# Patient Record
Sex: Male | Born: 1970 | Race: White | Hispanic: No | Marital: Married | Smoking: Never smoker
Health system: Southern US, Community
[De-identification: ages and names within clinical notes are randomized; demographics above are authoritative.]

## PROBLEM LIST (undated history)

## (undated) ENCOUNTER — Emergency Department (HOSPITAL_COMMUNITY): Admission: EM | Payer: Self-pay | Source: Home / Self Care

## (undated) DIAGNOSIS — K219 Gastro-esophageal reflux disease without esophagitis: Secondary | ICD-10-CM

## (undated) DIAGNOSIS — M542 Cervicalgia: Secondary | ICD-10-CM

## (undated) DIAGNOSIS — R12 Heartburn: Secondary | ICD-10-CM

## (undated) HISTORY — PX: SHOULDER SURGERY: SHX246

## (undated) HISTORY — DX: Gastro-esophageal reflux disease without esophagitis: K21.9

---

## 2011-04-20 ENCOUNTER — Encounter (HOSPITAL_COMMUNITY): Payer: BC Managed Care – PPO

## 2011-04-20 ENCOUNTER — Other Ambulatory Visit: Payer: Self-pay | Admitting: Orthopedic Surgery

## 2011-04-30 ENCOUNTER — Ambulatory Visit (HOSPITAL_COMMUNITY)
Admission: RE | Admit: 2011-04-30 | Discharge: 2011-04-30 | Disposition: A | Discharge status: Home / Self Care | Payer: BC Managed Care – PPO | Source: Ambulatory Visit | Attending: Orthopedic Surgery | Admitting: Orthopedic Surgery

## 2011-04-30 DIAGNOSIS — M19019 Primary osteoarthritis, unspecified shoulder: Secondary | ICD-10-CM | POA: Insufficient documentation

## 2011-04-30 DIAGNOSIS — M719 Bursopathy, unspecified: Secondary | ICD-10-CM | POA: Insufficient documentation

## 2011-04-30 DIAGNOSIS — M25819 Other specified joint disorders, unspecified shoulder: Secondary | ICD-10-CM | POA: Insufficient documentation

## 2011-04-30 DIAGNOSIS — Z01812 Encounter for preprocedural laboratory examination: Secondary | ICD-10-CM | POA: Insufficient documentation

## 2011-04-30 DIAGNOSIS — M67919 Unspecified disorder of synovium and tendon, unspecified shoulder: Secondary | ICD-10-CM | POA: Insufficient documentation

## 2011-04-30 DIAGNOSIS — A4902 Methicillin resistant Staphylococcus aureus infection, unspecified site: Secondary | ICD-10-CM | POA: Insufficient documentation

## 2011-04-30 DIAGNOSIS — M24119 Other articular cartilage disorders, unspecified shoulder: Secondary | ICD-10-CM | POA: Insufficient documentation

## 2011-04-30 LAB — CBC
HCT: 42.7 % (ref 39.0–52.0)
MCH: 30.2 pg (ref 26.0–34.0)
MCV: 88.2 fL (ref 78.0–100.0)
Platelets: 129 10*3/uL — ABNORMAL LOW (ref 150–400)
RDW: 12.8 % (ref 11.5–15.5)

## 2011-05-06 NOTE — Op Note (Signed)
NAME:  James Cherry, James Cherry NO.:  [id number]  MEDICAL RECORD NO.:  [id number]  LOCATION:  DAYL                         FACILITY:  [facility]  PHYSICIAN:  Marlowe Kays, M.D.  DATE OF BIRTH:  12-30-1970  DATE OF PROCEDURE:  04/30/2011 DATE OF DISCHARGE:  04/30/2011                              OPERATIVE REPORT   PREOPERATIVE DIAGNOSES: 1. Labral degenerative tear. 2. 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 3. Acromioclavicular joint arthritis, right shoulder.  POSTOPERATIVE DIAGNOSES: 1. Labral degenerative tear. 2. 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 3. Acromioclavicular joint arthritis, right shoulder.  OPERATION: 1. Right shoulder arthroscopy with labral debridement and subacromial     decompression with shaving of the bursal surface of the rotator     cuff. 2. Open distal clavicle resection.  SURGEON:  Marlowe Kays, M.D.  ASSISTANT:  Mr. Idolina Primer, [facility]. ANESTHESIA:  General.  PLAN/JUSTIFICATION FOR PROCEDURE:  Painful right shoulder with MRI demonstrating preoperative diagnoses.  DESCRIPTION OF PROCEDURE:  Prophylactic antibiotics due to MRSA culture from his nose, vancomycin was used.  Satisfactory interscalene block by anesthesia, satisfied general anesthesia, beach-chair position on Allen frame, right shoulder girdle was prepped with DuraPrep and draped in sterile field.  Anatomy of the shoulder joint was marked out and time out was performed, I injected location for the posterior and lateral portals in subacromial space and for the incision for distal clavicle and with Marcaine with adrenaline.  Through posterior soft spot portal, we atraumatically entered the glenohumeral joint and on inspection found that the labral tear involved 180 degrees of the labrum working from the side of the biceps anchor.  Accordingly, I advanced the scope between the subscapularis and biceps tendon and using switching  stick made an anterior incision.  Over the switching stick, I then placed a metal cannula over the switching stick by 4.2 shaver entering the joint and shaving down the labral tear until it was smooth as possible.  There was some minimal wear of the glenoid.  I then redirected the scope to the subacromial space, he had large amount of bursal tissue.  I cleared the majority of this out with a 4.2 shaver.  I then brought an ArthroCare vaporizer and began removing soft tissue from the undersurface of the acromion back identifying the [facility] joint.  I followed this with a 4-mm oval bur and began burring down the subacromial space back and forth between the bur, the vaporizer, and a 4.2 shaver.  In the process of shaving the bursal surface of the rotator cuff which had been quite irritated but did not appear to have a definite tear and in keeping with the MRI.  At the conclusion of the decompression, she had wide decompression of the rotator cuff documented with pictures with his arm to side and the arm abducted.  I then removed all fluid possible from the subacromial space and we made an open incision over the distal clavicle identifying the [facility] joint with subperiosteal cautery dissection. I then undermined the distal clavicle and measured back 1.5 cm from [facility] joint, placing the scribe line on the clavicle at that point.  I then placed a baby Bennett beneath the clavicle and using  microsaw amputated the clavicle which I removed with towel clip and cautery technique.  I checked and there were no remaining spicules of bone on the apparent clavicle which I covered with bone wax.  I then irrigated the wound well and placed Gelfoam into the resection site and then closed the wound with interrupted #1 Vicryl in periosteal fascial layer, 2-0 Vicryl in subcutaneous tissue, Steri-Strips on the skin, 4-0 nylon in the 3 portals which were also covered with Betadine and Adaptic dry.  Dry sterile dressing and  shoulder immobilizer were applied.  At the time of this dictation, the patient was on its way to recovery room in satisfactory condition with no known complications.          ______________________________ Marlowe Kays, M.D.     JA/MEDQ  D:  04/30/2011  T:  04/30/2011  Job:  [id number]  Electronically Signed by Marlowe Kays M.D. on 05/06/2011 02:22:51 PM

## 2013-08-11 ENCOUNTER — Emergency Department (HOSPITAL_COMMUNITY)
Admission: EM | Admit: 2013-08-11 | Discharge: 2013-08-11 | Disposition: A | Discharge status: Home / Self Care | Payer: BC Managed Care – PPO | Attending: Emergency Medicine | Admitting: Emergency Medicine

## 2013-08-11 ENCOUNTER — Emergency Department (HOSPITAL_COMMUNITY): Payer: BC Managed Care – PPO

## 2013-08-11 ENCOUNTER — Encounter (HOSPITAL_COMMUNITY): Payer: Self-pay

## 2013-08-11 DIAGNOSIS — M549 Dorsalgia, unspecified: Secondary | ICD-10-CM | POA: Insufficient documentation

## 2013-08-11 DIAGNOSIS — R0789 Other chest pain: Secondary | ICD-10-CM | POA: Insufficient documentation

## 2013-08-11 DIAGNOSIS — R209 Unspecified disturbances of skin sensation: Secondary | ICD-10-CM | POA: Insufficient documentation

## 2013-08-11 DIAGNOSIS — Z880 Allergy status to penicillin: Secondary | ICD-10-CM | POA: Insufficient documentation

## 2013-08-11 DIAGNOSIS — Z7982 Long term (current) use of aspirin: Secondary | ICD-10-CM | POA: Insufficient documentation

## 2013-08-11 DIAGNOSIS — R079 Chest pain, unspecified: Secondary | ICD-10-CM

## 2013-08-11 HISTORY — DX: Cervicalgia: M54.2

## 2013-08-11 HISTORY — DX: Heartburn: R12

## 2013-08-11 LAB — COMPREHENSIVE METABOLIC PANEL
Albumin: 3.9 g/dL (ref 3.5–5.2)
Alkaline Phosphatase: 64 U/L (ref 39–117)
BUN: 13 mg/dL (ref 6–23)
CO2: 28 mEq/L (ref 19–32)
Calcium: 9.7 mg/dL (ref 8.4–10.5)
Creatinine, Ser: 1.26 mg/dL (ref 0.50–1.35)
GFR calc Af Amer: 79 mL/min — ABNORMAL LOW (ref >90)
GFR calc non Af Amer: 68 mL/min — ABNORMAL LOW (ref >90)
Glucose, Bld: 115 mg/dL — ABNORMAL HIGH (ref 70–99)
Total Protein: 6.5 g/dL (ref 6.0–8.3)

## 2013-08-11 LAB — CBC WITH DIFFERENTIAL/PLATELET
Basophils Absolute: 0 10*3/uL (ref 0.0–0.1)
Eosinophils Absolute: 0.2 10*3/uL (ref 0.0–0.7)
Eosinophils Relative: 4 % (ref 0–5)
HCT: 44.6 % (ref 39.0–52.0)
Hemoglobin: 15.2 g/dL (ref 13.0–17.0)
Lymphocytes Relative: 42 % (ref 12–46)
Lymphs Abs: 2.2 10*3/uL (ref 0.7–4.0)
MCH: 30.5 pg (ref 26.0–34.0)
MCHC: 34.1 g/dL (ref 30.0–36.0)
MCV: 89.6 fL (ref 78.0–100.0)
Monocytes Absolute: 0.4 10*3/uL (ref 0.1–1.0)
Platelets: 171 10*3/uL (ref 150–400)
RBC: 4.98 MIL/uL (ref 4.22–5.81)
WBC: 5.1 10*3/uL (ref 4.0–10.5)

## 2013-08-11 LAB — POCT I-STAT TROPONIN I: Troponin i, poc: 0 ng/mL (ref 0.00–0.08)

## 2013-08-11 NOTE — ED Notes (Signed)
Patient complaining of a chest heaviness that start about 2 weeks ago. Stated that he has been having a lot of stress. Currently not having any chest pain. When he has chest pain he can fell it in his back behind his heart. Occasionally has heart burn. Is not on any BP medications.

## 2013-08-11 NOTE — ED Notes (Signed)
Trixie Dredge in to talk with patient

## 2013-08-11 NOTE — ED Provider Notes (Signed)
CSN: [id number]     Arrival date & time 08/11/13  1413 History   First MD Initiated Contact with Patient 08/11/13 1421     Chief Complaint  Patient presents with  . Chest Pain   (Consider location/radiation/quality/duration/timing/severity/associated sxs/prior Treatment) HPI Patient presents with concern of chest pain.  States he has had intermittent chest pain for the past 12 days.  States the pain began while he was sitting at work, on a day that was much more stressful than usual.  Pain is located over left chest, described as pressure and squeezing, occasionally is also located in his left upper back, and has occasional tingling in his left hand.  The sensation lasts for seconds to hours.  States that it is improved and may go away with distraction.  States the sensation lasts until he can distract himself.  The pain is not exacerbated by exertion or deep inspiration.  It is improved with stretching and deep inspiration.  Denies nausea, vomiting, lightheadedness, dizziness, SOB, palpitations, leg swelling, abdominal pain, diaphoresis, syncope.   Pt is not a smoker.  He has no medical problems.  No family hx early CAD (mother has had MI and stents, but not until her 99s).  No past medical history on file. No past surgical history on file. No family history on file. History  Substance Use Topics  . Smoking status: Not on file  . Smokeless tobacco: Not on file  . Alcohol Use: Not on file    Review of Systems  Constitutional: Negative for fever.  Respiratory: Negative for cough and shortness of breath.   Cardiovascular: Positive for chest pain. Negative for palpitations and leg swelling.  Gastrointestinal: Negative for nausea, vomiting and abdominal pain.  Musculoskeletal: Positive for back pain.  Skin: Negative for rash.  Neurological: Negative for dizziness, syncope, weakness, light-headedness and headaches.    Allergies  Demerol and Penicillins  Home Medications   Current  Outpatient Rx  Name  Route  Sig  Dispense  Refill  . aspirin 325 MG tablet   Oral   Take 325 mg by mouth daily.         Marland Kitchen ibuprofen (ADVIL,MOTRIN) 200 MG tablet   Oral   Take 200 mg by mouth every 6 (six) hours as needed for pain.          BP 132/94  Pulse 72  Temp(Src) 97.8 F (36.6 C) (Oral)  Resp 16  SpO2 95% Physical Exam  Nursing note and vitals reviewed. Constitutional: He appears well-developed and well-nourished. No distress.  HENT:  Head: Normocephalic and atraumatic.  Neck: Neck supple.  Cardiovascular: Normal rate and regular rhythm.   Pulmonary/Chest: Effort normal and breath sounds normal. No respiratory distress. He has no wheezes. He has no rales.  Abdominal: Soft. He exhibits no distension and no mass. There is no tenderness. There is no rebound and no guarding.  Musculoskeletal: He exhibits no edema.  Neurological: He is alert. He exhibits normal muscle tone.  Skin: He is not diaphoretic.    ED Course  Procedures (including critical care time) Labs Review Labs Reviewed  COMPREHENSIVE METABOLIC PANEL - Abnormal; Notable for the following:    Glucose, Bld 115 (*)    GFR calc non Af Amer 68 (*)    GFR calc Af Amer 79 (*)    All other components within normal limits  CBC WITH DIFFERENTIAL  POCT I-STAT TROPONIN I   Imaging Review Dg Chest 2 View  08/11/2013   CLINICAL DATA:  chest pain  EXAM: CHEST  2 VIEW  COMPARISON:  None.  FINDINGS: The lungs are clear. Heart size and pulmonary vascularity are normal. No adenopathy. No pneumothorax. There is slight upper thoracic dextroscoliosis.  IMPRESSION: No edema or consolidation.   Electronically Signed   By: Bretta Bang M.D.   On: 08/11/2013 15:30     Date: 08/11/2013  Rate: 77  Rhythm: normal sinus rhythm  QRS Axis: normal  Intervals: normal  ST/T Wave abnormalities: normal  Conduction Disutrbances: none  Narrative Interpretation:   Old EKG Reviewed: not available  Discussed pt with Dr  Romeo Apple    MDM   1. Chest pain      Patient with atypical chest pain at rest that appears to be worse with /related to emotional stress at work.  He is low risk - HEART score is zero. No family hx early CAD. Discussed results and follow up  with patient.  Pt given return precautions.  Pt verbalizes understanding and agrees with plan.      I doubt any other EMC precluding discharge at this time including, but not necessarily limited to the following: ACS, PE, aortic dissection, pneumonia, cholecystitis     Trixie Dredge, PA-C 08/11/13 1548

## 2013-08-11 NOTE — ED Notes (Signed)
Patient had episode of chest pressure that lasted apprx 30sec.

## 2013-08-11 NOTE — ED Provider Notes (Signed)
Medical screening examination/treatment/procedure(s) were performed by non-physician practitioner and as supervising physician I was immediately available for consultation/collaboration.   Junius Argyle, MD 08/11/13 1921

## 2013-08-24 LAB — LIPID PANEL
Cholesterol: 209 — AB (ref 0–200)
HDL: 39 (ref 35–70)
LDL Cholesterol: 126
Triglycerides: 217 — AB (ref 40–160)

## 2013-09-14 ENCOUNTER — Ambulatory Visit (INDEPENDENT_AMBULATORY_CARE_PROVIDER_SITE_OTHER): Payer: BC Managed Care – PPO | Admitting: Physician Assistant

## 2013-09-14 ENCOUNTER — Encounter: Payer: Self-pay | Admitting: Cardiovascular Disease

## 2013-09-14 DIAGNOSIS — R079 Chest pain, unspecified: Secondary | ICD-10-CM

## 2013-09-14 NOTE — Progress Notes (Signed)
Exercise Treadmill Test James Cherry is a 42 y.o. male non-smoker with no hx of CAD, HTN, HL, diabetes mellitus referred by his PCP for ETT due to hx of recent chest pain.  He was in the ED last month with CP.  CEs were normal.  No hx of DOE, syncope.  Exam unremarkable.  ECG:  NSR, no ST changes.  Pre-Exercise Testing Evaluation Rhythm: normal sinus  Rate: 72 bpm     Test  Exercise Tolerance Test Ordering MD: Kristeen Miss, MD  Interpreting MD: Tereso Newcomer, PA-C  Unique Test No: 1  Treadmill:  1  Indication for ETT: chest pain - rule out ischemia  Contraindication to ETT: No   Stress Modality: exercise - treadmill  Cardiac Imaging Performed: non   Protocol: standard Bruce - maximal  Max BP:  170/63  Max MPHR (bpm):  176 85% MPR (bpm):  150  MPHR obtained (bpm):  181 % MPHR obtained:  102  Reached 85% MPHR (min:sec):  9:17 Total Exercise Time (min-sec):  11:59  Workload in METS:  13.4 Borg Scale: 17  Reason ETT Terminated:  desired heart rate attained    ST Segment Analysis At Rest: normal ST segments - no evidence of significant ST depression With Exercise: no evidence of significant ST depression  Other Information Arrhythmia:  No Angina during ETT:  absent (0) Quality of ETT:  diagnostic  ETT Interpretation:  normal - no evidence of ischemia by ST analysis  Comments: Excellent exercise capacity. No chest pain. Normal BP response to exercise. No ST changes to suggest ischemia.  Recommendations: F/u with PCP as directed. Signed,  Tereso Newcomer, PA-C   09/14/2013 4:08 PM

## 2014-06-23 IMAGING — CR DG CHEST 2V
2 series · 2 of 2 positions shown · non-contrast
Comparison: None.

CLINICAL DATA: chest pain

EXAM:
CHEST  2 VIEW

[w chest pa]
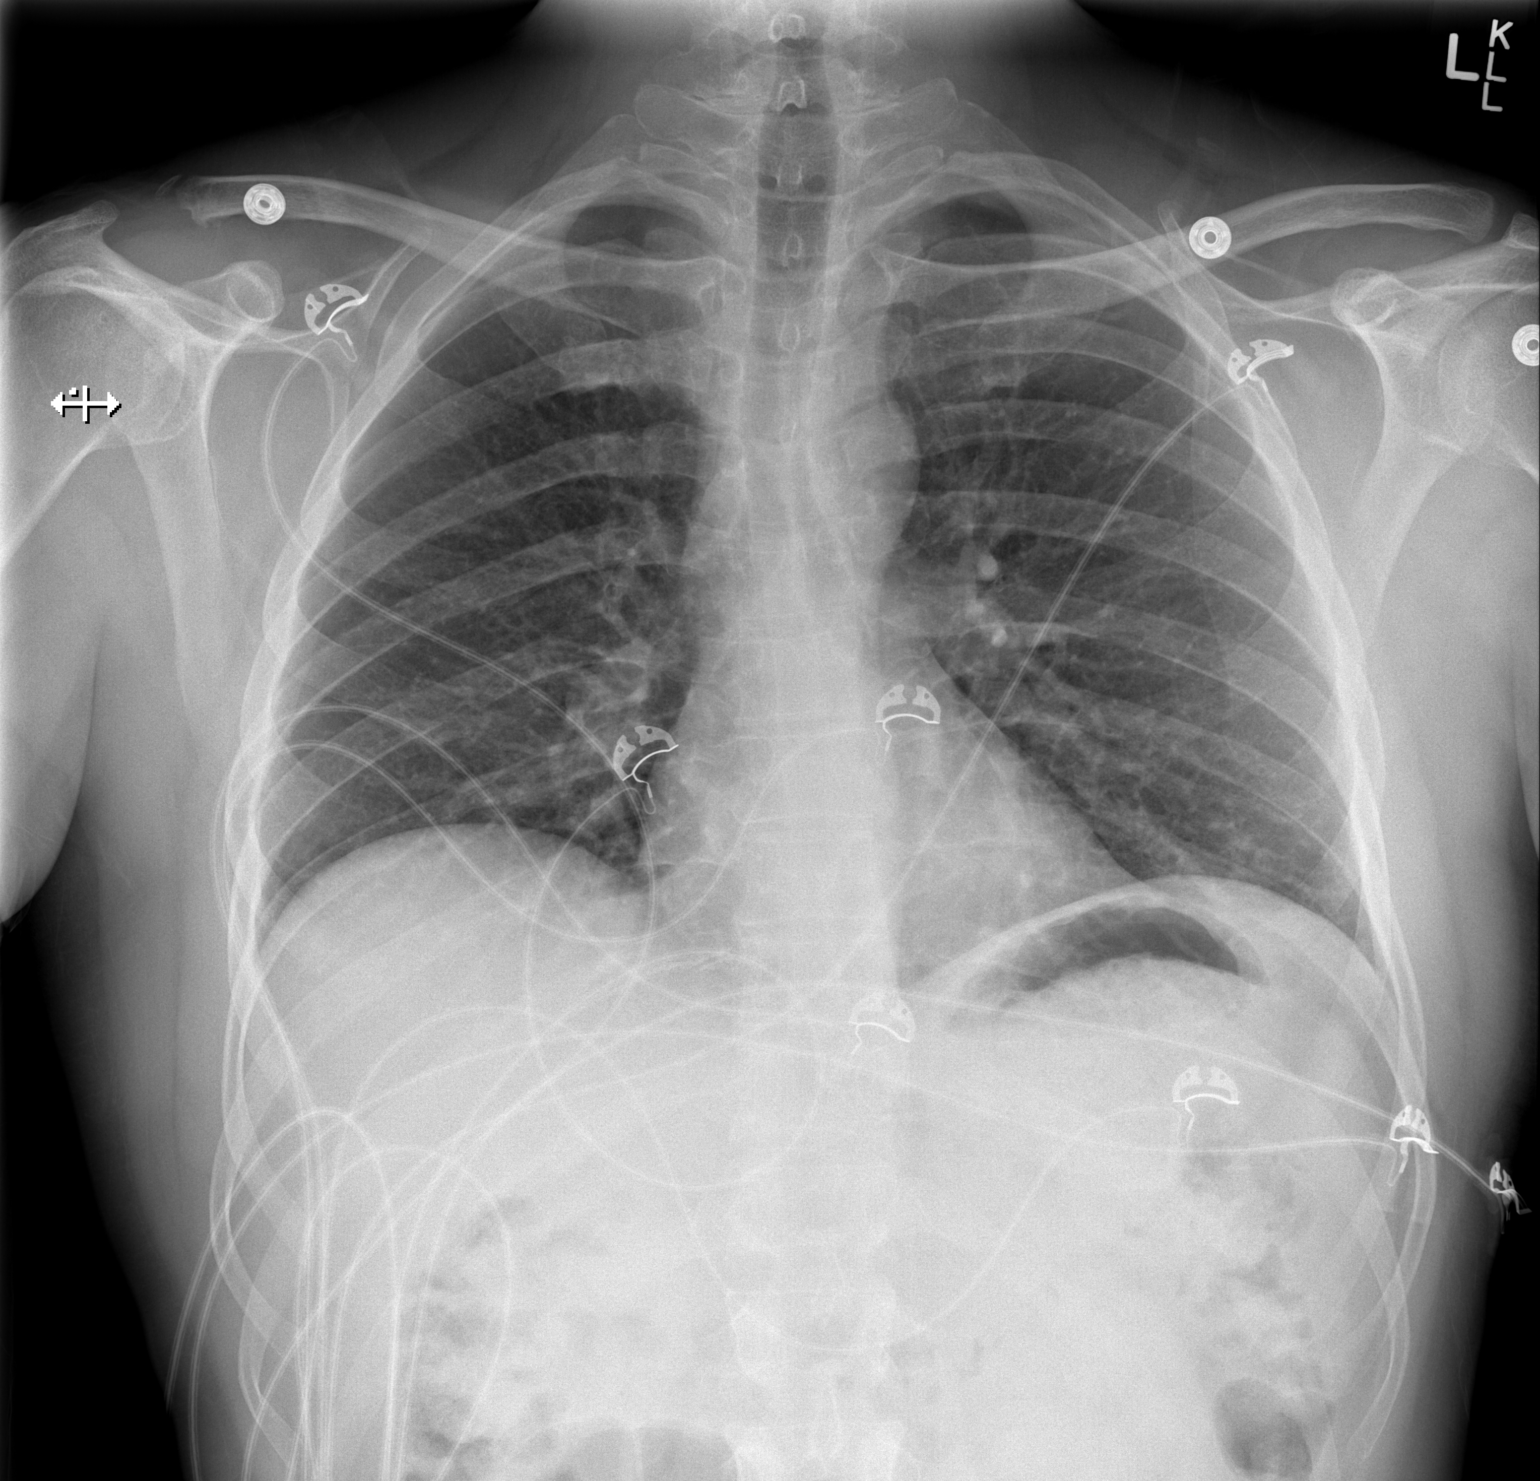

[w chest lat]
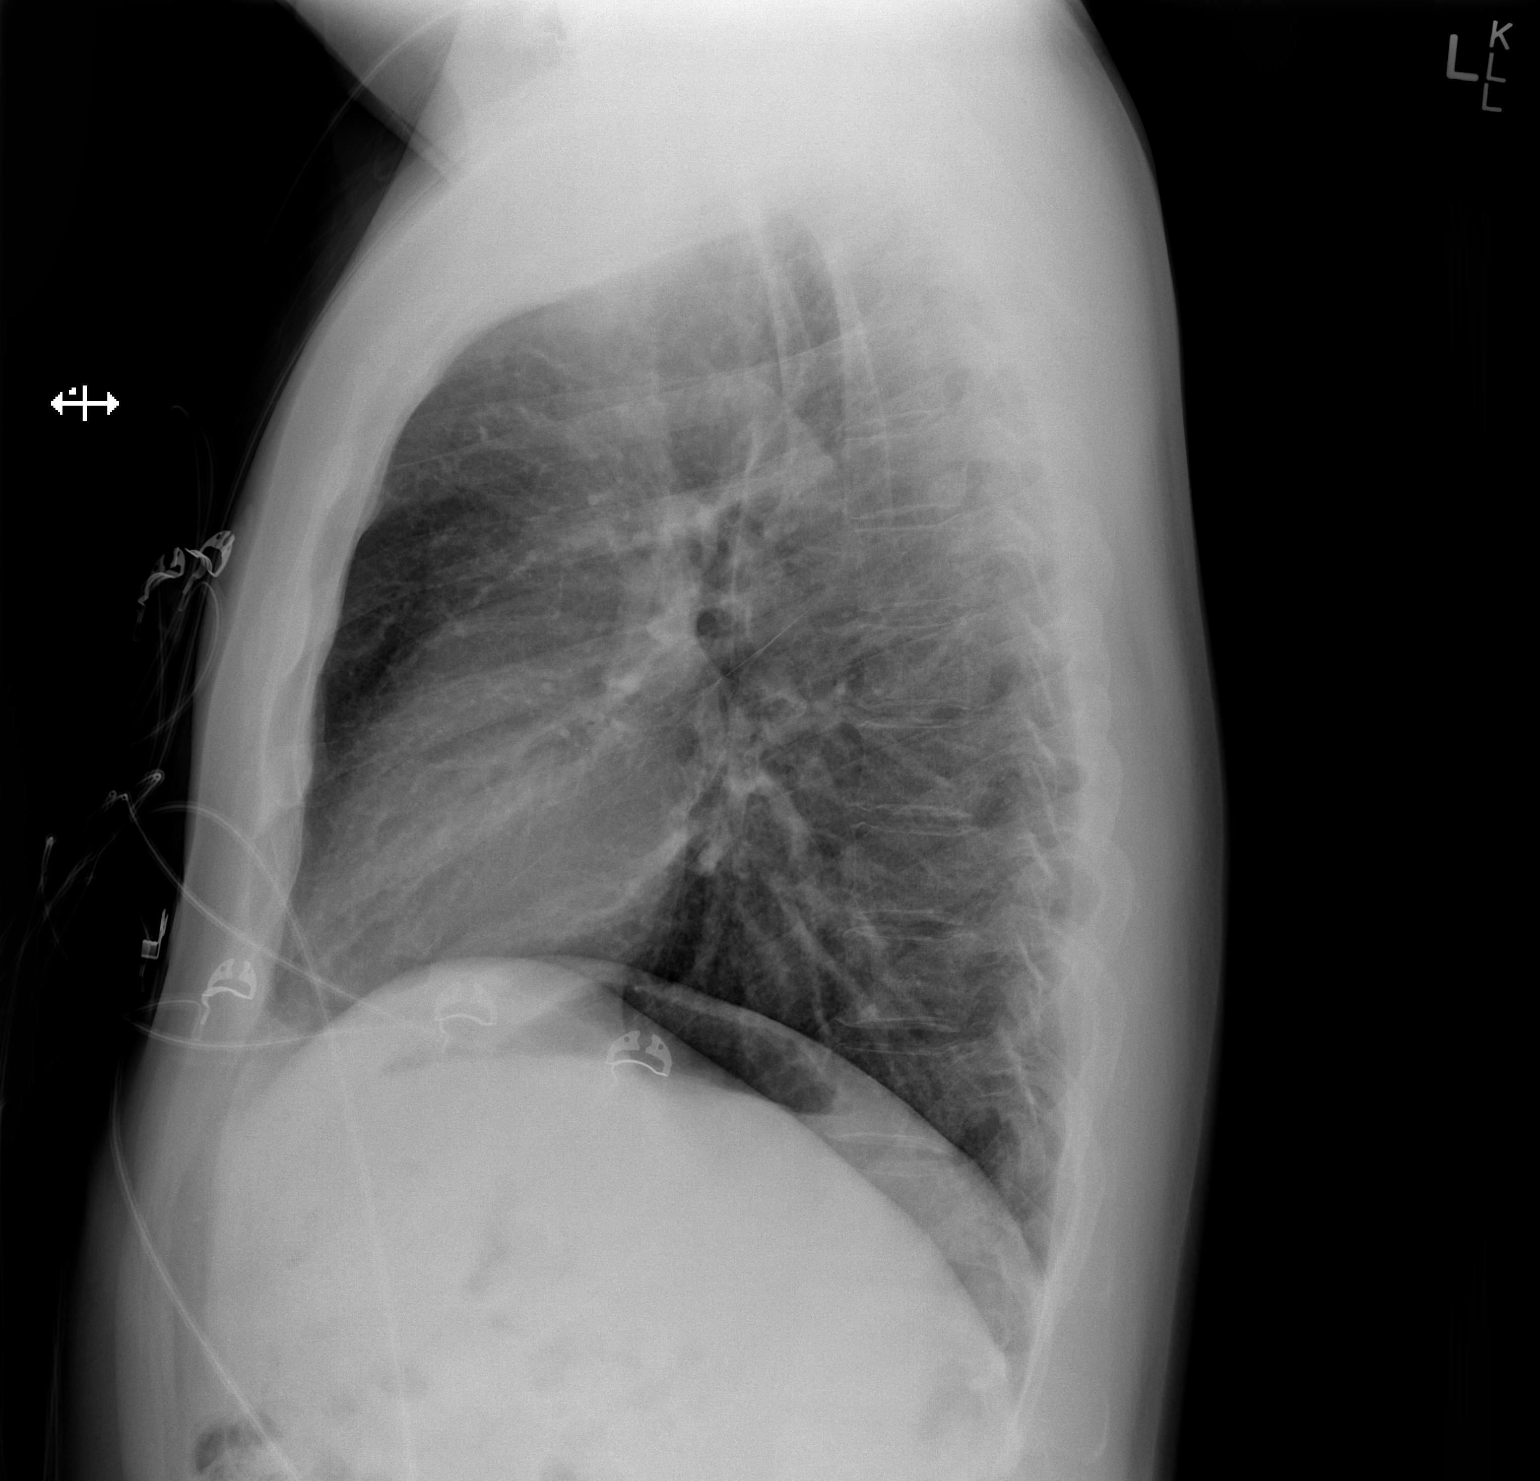

[2 of 2 positions shown; findings below may reference images not displayed]

FINDINGS: The lungs are clear. Heart size and pulmonary vascularity are
normal. No adenopathy. No pneumothorax. There is slight upper
thoracic dextroscoliosis.
IMPRESSION: No edema or consolidation.

## 2019-02-09 ENCOUNTER — Encounter: Payer: Self-pay | Admitting: Adult Health

## 2019-02-09 ENCOUNTER — Ambulatory Visit (INDEPENDENT_AMBULATORY_CARE_PROVIDER_SITE_OTHER): Payer: 59 | Admitting: Adult Health

## 2019-02-09 ENCOUNTER — Other Ambulatory Visit: Payer: Self-pay

## 2019-02-09 VITALS — BP 121/71 | HR 57 | Temp 98.3°F | Ht 68.0 in | Wt 184.8 lb

## 2019-02-09 DIAGNOSIS — Z0000 Encounter for general adult medical examination without abnormal findings: Secondary | ICD-10-CM

## 2019-02-09 DIAGNOSIS — E7800 Pure hypercholesterolemia, unspecified: Secondary | ICD-10-CM | POA: Diagnosis not present

## 2019-02-09 NOTE — Patient Instructions (Signed)
Mediterranean Diet A Mediterranean diet refers to food and lifestyle choices that are based on the traditions of countries located on the [employer]. This way of eating has been shown to help prevent certain conditions and improve outcomes for people who have chronic diseases, like kidney disease and heart disease. What are tips for following this plan? Lifestyle  Cook and eat meals together with your family, when possible.  Drink enough fluid to keep your urine clear or pale yellow.  Be physically active every day. This includes: ? Aerobic exercise like running or swimming. ? Leisure activities like gardening, walking, or housework.  Get 7-8 hours of sleep each night.  If recommended by your health care provider, drink red wine in moderation. This means 1 glass a day for nonpregnant women and 2 glasses a day for men. A glass of wine equals 5 oz (150 mL). Reading food labels   Check the serving size of packaged foods. For foods such as rice and pasta, the serving size refers to the amount of cooked product, not dry.  Check the total fat in packaged foods. Avoid foods that have saturated fat or trans fats.  Check the ingredients list for added sugars, such as corn syrup. Shopping  At the grocery store, buy most of your food from the areas near the walls of the store. This includes: ? Fresh fruits and vegetables (produce). ? Grains, beans, nuts, and seeds. Some of these may be available in unpackaged forms or large amounts (in bulk). ? Fresh seafood. ? Poultry and eggs. ? Low-fat dairy products.  Buy whole ingredients instead of prepackaged foods.  Buy fresh fruits and vegetables in-season from local farmers markets.  Buy frozen fruits and vegetables in resealable bags.  If you do not have access to quality fresh seafood, buy precooked frozen shrimp or canned fish, such as tuna, salmon, or sardines.  Buy small amounts of raw or cooked vegetables, salads, or olives from  the deli or salad bar at your store.  Stock your pantry so you always have certain foods on hand, such as olive oil, canned tuna, canned tomatoes, rice, pasta, and beans. Cooking  Cook foods with extra-virgin olive oil instead of using butter or other vegetable oils.  Have meat as a side dish, and have vegetables or grains as your main dish. This means having meat in small portions or adding small amounts of meat to foods like pasta or stew.  Use beans or vegetables instead of meat in common dishes like chili or lasagna.  Experiment with different cooking methods. Try roasting or broiling vegetables instead of steaming or sauteing them.  Add frozen vegetables to soups, stews, pasta, or rice.  Add nuts or seeds for added healthy fat at each meal. You can add these to yogurt, salads, or vegetable dishes.  Marinate fish or vegetables using olive oil, lemon juice, garlic, and fresh herbs. Meal planning   Plan to eat 1 vegetarian meal one day each week. Try to work up to 2 vegetarian meals, if possible.  Eat seafood 2 or more times a week.  Have healthy snacks readily available, such as: ? Vegetable sticks with hummus. ? [place] yogurt. ? Fruit and nut trail mix.  Eat balanced meals throughout the week. This includes: ? Fruit: 2-3 servings a day ? Vegetables: 4-5 servings a day ? Low-fat dairy: 2 servings a day ? Fish, poultry, or lean meat: 1 serving a day ? Beans and legumes: 2 or more servings a week ?  Nuts and seeds: 1-2 servings a day ? Whole grains: 6-8 servings a day ? Extra-virgin olive oil: 3-4 servings a day  Limit red meat and sweets to only a few servings a month What are my food choices?  Mediterranean diet ? Recommended ? Grains: Whole-grain pasta. Brown rice. Bulgar wheat. Polenta. Couscous. Whole-wheat bread. Orpah Cobb. ? Vegetables: Artichokes. Beets. Broccoli. Cabbage. Carrots. Eggplant. Green beans. Chard. Kale. Spinach. Onions. Leeks. Peas. Squash.  Tomatoes. Peppers. Radishes. ? Fruits: Apples. Apricots. Avocado. Berries. Bananas. Cherries. Dates. Figs. Grapes. Lemons. Melon. Oranges. Peaches. Plums. Pomegranate. ? Meats and other protein foods: Beans. Almonds. Sunflower seeds. Pine nuts. Peanuts. Cod. Salmon. Scallops. Shrimp. Tuna. Tilapia. Clams. Oysters. Eggs. ? Dairy: Low-fat milk. Cheese. Greek yogurt. ? Beverages: Water. Red wine. Herbal tea. ? Fats and oils: Extra virgin olive oil. Avocado oil. Grape seed oil. ? Sweets and desserts: [place] yogurt with honey. Baked apples. Poached pears. Trail mix. ? Seasoning and other foods: Basil. Cilantro. Coriander. Cumin. Mint. Parsley. Sage. Rosemary. Tarragon. Garlic. Oregano. Thyme. Pepper. Balsalmic vinegar. Tahini. Hummus. Tomato sauce. Olives. Mushrooms. ? Limit these ? Grains: Prepackaged pasta or rice dishes. Prepackaged cereal with added sugar. ? Vegetables: Deep fried potatoes (french fries). ? Fruits: Fruit canned in syrup. ? Meats and other protein foods: Beef. Pork. Lamb. Poultry with skin. Hot dogs. Tomasa Blase. ? Dairy: Ice cream. Sour cream. Whole milk. ? Beverages: Juice. Sugar-sweetened soft drinks. Beer. Liquor and spirits. ? Fats and oils: Butter. Canola oil. Vegetable oil. Beef fat (tallow). Lard. ? Sweets and desserts: Cookies. Cakes. Pies. Candy. ? Seasoning and other foods: Mayonnaise. Premade sauces and marinades. ? The items listed may not be a complete list. Talk with your dietitian about what dietary choices are right for you. Summary  The Mediterranean diet includes both food and lifestyle choices.  Eat a variety of fresh fruits and vegetables, beans, nuts, seeds, and whole grains.  Limit the amount of red meat and sweets that you eat.  Talk with your health care provider about whether it is safe for you to drink red wine in moderation. This means 1 glass a day for nonpregnant women and 2 glasses a day for men. A glass of wine equals 5 oz (150 mL). This information  is not intended to replace advice given to you by your health care provider. Make sure you discuss any questions you have with your health care provider. Document Released: 06/18/2016 Document Revised: 07/21/2016 Document Reviewed: 06/18/2016 Elsevier Interactive Patient Education  2019 [employer].   Continue to drink plenty of water, follow Mediterranean diet, and reduce overall ice cream consumption. Increase regular exercise.  Recommend at least 30 minutes daily, 5 days per week of walking, jogging, biking, swimming, YouTube/Pinterest workout videos. Once you meet AAA screening guidelines, we will send you for imaging. Please schedule complete physical in 2 months, fasting labs the week prior. WELCOME TO THE PRACTICE!

## 2019-02-09 NOTE — Assessment & Plan Note (Signed)
Continue to drink plenty of water, follow Mediterranean diet, and reduce overall ice cream consumption. Increase regular exercise.  Recommend at least 30 minutes daily, 5 days per week of walking, jogging, biking, swimming, YouTube/Pinterest workout videos. Once you meet AAA screening guidelines, we will send you for imaging. Please schedule complete physical in 2 months, fasting labs the week prior.

## 2019-02-09 NOTE — Assessment & Plan Note (Signed)
2014 labs- LDL 126 Advised to reduce beef and ice cream intake Increase cardiovascular exercise Will recheck lipids at CPE in 2 months

## 2019-02-09 NOTE — Progress Notes (Signed)
Subjective:    Patient ID: James Cherry, male    DOB: Jan 28, 1971, 48 y.o.   MRN: [id number]  HPI:  James Cherry is here to establish as a new pt.  He is a pleasant 48 year old male. PMH:HLD, Tension HA (estimates 1 HA/months, easily treated with OTC NSAIDs). He is concerned about AAA. He reports paternal uncle passing away in his mid 21s from AAA rupture.  He is unsure of his uncle's social hx. He denies tobacco use He is only 48 years old and his BP is excellent- 121/71, HR 57 He estimates to drink >80 oz water/day and is quite active- [place], swims every other day springs/summer He reports eating beef daily and nightly ice cream eating He has not had CPE with fasting labs since 2014 He denies acute complaints/issues  Patient Care Team    Relationship Specialty Notifications Start End  , Jinny Blossom, NP PCP - General Family Medicine  02/09/19     Patient Active Problem List   Diagnosis Date Noted  . Healthcare maintenance 02/09/2019  . Elevated LDL cholesterol level 02/09/2019     Past Medical History:  Diagnosis Date  . Heart burn   . Neck pain      Past Surgical History:  Procedure Laterality Date  . SHOULDER SURGERY       Family History  Problem Relation Age of Onset  . Hyperlipidemia Mother   . Aortic aneurysm Father   . Aneurysm Father        iliac  . Stroke Maternal Grandfather      Social History   Substance and Sexual Activity  Drug Use No     Social History   Substance and Sexual Activity  Alcohol Use No     Social History   Tobacco Use  Smoking Status Never Smoker  Smokeless Tobacco Never Used     Outpatient Encounter Medications as of 02/09/2019  Medication Sig  . aspirin 325 MG tablet Take 325 mg by mouth daily.  Marland Kitchen ibuprofen (ADVIL,MOTRIN) 200 MG tablet Take 200 mg by mouth every 6 (six) hours as needed for pain.   No facility-administered encounter medications on file as of 02/09/2019.      Allergies: Demerol [meperidine] and Penicillins  Body mass index is 28.1 kg/m.  Blood pressure 121/71, pulse (!) 57, temperature 98.3 F (36.8 C), temperature source Oral, height 5\' 8"  (1.727 m), weight 184 lb 12.8 oz (83.8 kg), SpO2 98 %.  Review of Systems  Constitutional: Positive for fatigue. Negative for activity change, appetite change, chills, diaphoresis, fever and unexpected weight change.  Eyes: Negative for visual disturbance.  Respiratory: Negative for cough, chest tightness, shortness of breath, wheezing and stridor.   Cardiovascular: Negative for chest pain, palpitations and leg swelling.  Gastrointestinal: Negative for abdominal distention, abdominal pain, blood in stool, constipation, diarrhea, nausea and vomiting.  Endocrine: Negative for cold intolerance, heat intolerance, polydipsia, polyphagia and polyuria.  Genitourinary: Negative for difficulty urinating and flank pain.  Musculoskeletal: Negative for arthralgias, back pain, gait problem, joint swelling, myalgias, neck pain and neck stiffness.  Skin: Negative for color change, pallor, rash and wound.  Neurological: Negative for dizziness and headaches.  Hematological: Does not bruise/bleed easily.  Psychiatric/Behavioral: Negative for agitation, behavioral problems, confusion, decreased concentration, dysphoric mood, hallucinations, self-injury, sleep disturbance and suicidal ideas. The patient is not nervous/anxious and is not hyperactive.        Objective:   Physical Exam Vitals signs and nursing  note reviewed.  Constitutional:      General: He is not in acute distress.    Appearance: Normal appearance. He is normal weight. He is not ill-appearing, toxic-appearing or diaphoretic.  HENT:     Head: Normocephalic and atraumatic.  Eyes:     Extraocular Movements: Extraocular movements intact.     Conjunctiva/sclera: Conjunctivae normal.     Pupils: Pupils are equal, round, and reactive to light.   Cardiovascular:     Rate and Rhythm: Normal rate.     Pulses: Normal pulses.     Heart sounds: Normal heart sounds. No murmur. No friction rub. No gallop.   Pulmonary:     Effort: Pulmonary effort is normal. No respiratory distress.     Breath sounds: Normal breath sounds. No stridor. No wheezing, rhonchi or rales.  Chest:     Chest wall: No tenderness.  Skin:    Capillary Refill: Capillary refill takes less than 2 seconds.  Neurological:     Mental Status: He is alert and oriented to person, place, and time.  Psychiatric:        Mood and Affect: Mood normal.        Behavior: Behavior normal.        Thought Content: Thought content normal.        Judgment: Judgment normal.         Assessment & Plan:   1. Healthcare maintenance   2. Elevated LDL cholesterol level     Healthcare maintenance Continue to drink plenty of water, follow Mediterranean diet, and reduce overall ice cream consumption. Increase regular exercise.  Recommend at least 30 minutes daily, 5 days per week of walking, jogging, biking, swimming, YouTube/Pinterest workout videos. Once you meet AAA screening guidelines, we will send you for imaging. Please schedule complete physical in 2 months, fasting labs the week prior.  Elevated LDL cholesterol level 2014 labs- LDL 126 Advised to reduce beef and ice cream intake Increase cardiovascular exercise Will recheck lipids at CPE in 2 months     FOLLOW-UP:  Return in about 2 months (around 04/11/2019) for CPE, Fasting Labs.

## 2019-04-11 ENCOUNTER — Other Ambulatory Visit (INDEPENDENT_AMBULATORY_CARE_PROVIDER_SITE_OTHER): Payer: 59

## 2019-04-11 ENCOUNTER — Other Ambulatory Visit: Payer: Self-pay

## 2019-04-11 DIAGNOSIS — Z0000 Encounter for general adult medical examination without abnormal findings: Secondary | ICD-10-CM

## 2019-04-12 ENCOUNTER — Other Ambulatory Visit: Payer: 59

## 2019-04-12 LAB — CBC WITH DIFFERENTIAL/PLATELET
Basophils Absolute: 0 10*3/uL (ref 0.0–0.2)
Basos: 1 %
EOS (ABSOLUTE): 0.2 10*3/uL (ref 0.0–0.4)
Eos: 3 %
Hematocrit: 45.9 % (ref 37.5–51.0)
Hemoglobin: 16.2 g/dL (ref 13.0–17.7)
Immature Grans (Abs): 0 10*3/uL (ref 0.0–0.1)
Immature Granulocytes: 0 %
Lymphocytes Absolute: 2.2 10*3/uL (ref 0.7–3.1)
Lymphs: 40 %
MCH: 30.7 pg (ref 26.6–33.0)
MCHC: 35.3 g/dL (ref 31.5–35.7)
MCV: 87 fL (ref 79–97)
Monocytes Absolute: 0.5 10*3/uL (ref 0.1–0.9)
Monocytes: 9 %
Neutrophils Absolute: 2.6 10*3/uL (ref 1.4–7.0)
Neutrophils: 47 %
Platelets: 165 10*3/uL (ref 150–450)
RBC: 5.27 x10E6/uL (ref 4.14–5.80)
RDW: 12.2 % (ref 11.6–15.4)
WBC: 5.4 10*3/uL (ref 3.4–10.8)

## 2019-04-12 LAB — COMPREHENSIVE METABOLIC PANEL
ALT: 42 IU/L (ref 0–44)
AST: 29 IU/L (ref 0–40)
Albumin/Globulin Ratio: 2.6 — ABNORMAL HIGH (ref 1.2–2.2)
Albumin: 4.7 g/dL (ref 4.0–5.0)
Alkaline Phosphatase: 56 IU/L (ref 39–117)
BUN/Creatinine Ratio: 11 (ref 9–20)
BUN: 12 mg/dL (ref 6–24)
Bilirubin Total: 0.7 mg/dL (ref 0.0–1.2)
CO2: 26 mmol/L (ref 20–29)
Calcium: 9.8 mg/dL (ref 8.7–10.2)
Chloride: 101 mmol/L (ref 96–106)
Creatinine, Ser: 1.14 mg/dL (ref 0.76–1.27)
GFR calc Af Amer: 87 mL/min/{1.73_m2} (ref >59)
GFR calc non Af Amer: 75 mL/min/{1.73_m2} (ref >59)
Globulin, Total: 1.8 g/dL (ref 1.5–4.5)
Glucose: 99 mg/dL (ref 65–99)
Potassium: 4.7 mmol/L (ref 3.5–5.2)
Sodium: 141 mmol/L (ref 134–144)
Total Protein: 6.5 g/dL (ref 6.0–8.5)

## 2019-04-12 LAB — LIPID PANEL
Chol/HDL Ratio: 6 ratio — ABNORMAL HIGH (ref 0.0–5.0)
Cholesterol, Total: 269 mg/dL — ABNORMAL HIGH (ref 100–199)
HDL: 45 mg/dL (ref >39)
LDL Calculated: 169 mg/dL — ABNORMAL HIGH (ref 0–99)
Triglycerides: 277 mg/dL — ABNORMAL HIGH (ref 0–149)
VLDL Cholesterol Cal: 55 mg/dL — ABNORMAL HIGH (ref 5–40)

## 2019-04-12 LAB — TSH: TSH: 2.7 u[IU]/mL (ref 0.450–4.500)

## 2019-04-12 LAB — HEMOGLOBIN A1C
Est. average glucose Bld gHb Est-mCnc: 114 mg/dL
Hgb A1c MFr Bld: 5.6 % (ref 4.8–5.6)

## 2019-04-12 NOTE — Progress Notes (Signed)
Subjective:    Patient ID: James Cherry, male    DOB: Jan 31, 1971, 48 y.o.   MRN: [id number]  HPI:02/09/2019 OV: James Cherry is here to establish as a new pt.  He is a pleasant 48 year old male. PMH:HLD, Tension HA (estimates 1 HA/months, easily treated with OTC NSAIDs). He is concerned about AAA. He reports paternal uncle passing away in his mid 22s from AAA rupture.  He is unsure of his uncle's social hx. He denies tobacco use He is only 48 years old and his BP is excellent- 121/71, HR 57 He estimates to drink >80 oz water/day and is quite active- [place], swims every other day springs/summer He reports eating beef daily and nightly ice cream eating He has not had CPE with fasting labs since 2014 He denies acute complaints/issues  04/17/2019 OV: [name] presents for CPE He continues to drink >80 oz water/day and has been walking 1/2- 2/3 mile each evening. Now that the weather has warmed up, he has started swimming in his pool. He continues to eat ice cream most nights of the week and red meat 3-4 times/week He continues to abstain from tobacco/vape/ETOH  Recent Labs 04/11/2019: TSH-WNL, 2.700 The 10-year ASCVD risk score James George DC Jr., et al., 2013) is: 4.9% Values used to calculate the score:  Age: 58 years  Sex: Male  Is Non-Hispanic African American: No  Diabetic: No  Tobacco smoker: No  Systolic Blood Pressure: 121 mmHg  Is BP treated: No  HDL Cholesterol: 45 mg/dL  Total Cholesterol: 132 mg/dL GMW-102 V2Z-3.6, just under pre-diabetic range CMP-stable CBC-stable  Healthcare Maintenance: Immunizations-UTD   Patient Care Team    Relationship Specialty Notifications Start End  James Fusi, NP PCP - General Family Medicine  02/09/19     Patient Active Problem List   Diagnosis Date Noted  . Healthcare maintenance 02/09/2019  . Elevated LDL cholesterol level 02/09/2019     Past Medical History:   Diagnosis Date  . Heart burn   . Neck pain      Past Surgical History:  Procedure Laterality Date  . SHOULDER SURGERY       Family History  Problem Relation Age of Onset  . Hyperlipidemia Mother   . Aortic aneurysm Father   . Aneurysm Father        iliac  . Stroke Maternal Grandfather      Social History   Substance and Sexual Activity  Drug Use No     Social History   Substance and Sexual Activity  Alcohol Use No     Social History   Tobacco Use  Smoking Status Never Smoker  Smokeless Tobacco Never Used     Outpatient Encounter Medications as of 04/17/2019  Medication Sig  . ibuprofen (ADVIL,MOTRIN) 200 MG tablet Take 200 mg by mouth every 6 (six) hours as needed for pain.  . [DISCONTINUED] aspirin 325 MG tablet Take 325 mg by mouth daily.   No facility-administered encounter medications on file as of 04/17/2019.     Allergies: Demerol [meperidine] and Penicillins  Body mass index is 27.88 kg/m.  Blood pressure 110/64, pulse 62, temperature 98.7 F (37.1 C), temperature source Oral, height 5' 8.25" (1.734 m), weight 184 lb 11.2 oz (83.8 kg), SpO2 97 %.     Review of Systems  Constitutional: Positive for fatigue. Negative for activity change, appetite change, chills, diaphoresis, fever and unexpected weight change.  HENT: Negative for congestion.   Eyes: Negative  for visual disturbance.  Respiratory: Negative for cough, chest tightness, shortness of breath, wheezing and stridor.   Cardiovascular: Negative for chest pain, palpitations and leg swelling.  Gastrointestinal: Negative for abdominal distention, anal bleeding, blood in stool, constipation, diarrhea, nausea, rectal pain and vomiting.  Endocrine: Negative for cold intolerance, heat intolerance, polydipsia, polyphagia and polyuria.  Genitourinary: Negative for difficulty urinating and flank pain.  Musculoskeletal: Negative for arthralgias, back pain, gait problem, joint swelling, myalgias,  neck pain and neck stiffness.  Skin: Negative for color change, pallor, rash and wound.  Neurological: Negative for dizziness and headaches.  Hematological: Does not bruise/bleed easily.  Psychiatric/Behavioral: Negative for agitation, behavioral problems, confusion, decreased concentration, dysphoric mood, hallucinations, self-injury, sleep disturbance and suicidal ideas. The patient is not nervous/anxious and is not hyperactive.        Objective:   Physical Exam Vitals signs and nursing note reviewed.  Constitutional:      General: He is not in acute distress.    Appearance: Normal appearance. He is not ill-appearing, toxic-appearing or diaphoretic.  HENT:     Head: Normocephalic.     Right Ear: Tympanic membrane, ear canal and external ear normal.     Left Ear: Tympanic membrane, ear canal and external ear normal.     Nose: Nose normal. No congestion.     Mouth/Throat:     Mouth: Mucous membranes are moist.     Pharynx: No posterior oropharyngeal erythema.  Eyes:     Conjunctiva/sclera: Conjunctivae normal.     Pupils: Pupils are equal, round, and reactive to light.  Neck:     Musculoskeletal: Normal range of motion. No muscular tenderness.  Cardiovascular:     Rate and Rhythm: Normal rate.     Pulses: Normal pulses.     Heart sounds: Normal heart sounds. No murmur. No friction rub. No gallop.   Pulmonary:     Effort: Pulmonary effort is normal. No respiratory distress.     Breath sounds: Normal breath sounds. No stridor. No wheezing, rhonchi or rales.  Chest:     Chest wall: No tenderness.  Abdominal:     General: Bowel sounds are normal. There is no distension.     Palpations: There is no mass.     Tenderness: There is no abdominal tenderness. There is no right CVA tenderness, left CVA tenderness, guarding or rebound.     Hernia: No hernia is present.  Genitourinary:    Comments: He declined DRE/genital examination He denies any issues with urination No family hx of  prostate ca Musculoskeletal: Normal range of motion.        General: No tenderness.  Skin:    General: Skin is warm and dry.     Capillary Refill: Capillary refill takes less than 2 seconds.     Findings: No erythema.  Neurological:     Mental Status: He is alert and oriented to person, place, and time.  Psychiatric:        Mood and Affect: Mood normal.        Behavior: Behavior normal.        Thought Content: Thought content normal.        Judgment: Judgment normal.       Assessment & Plan:   1. Elevated LDL cholesterol level   2. Healthcare maintenance     Healthcare maintenance  Your blood pressure and weight look good today. Labs are stable, with elevations in your cholesterol panel. To lower your total cholesterol, Triglycerides, and LDL (  bad), and increase your HDL (good): Increase regular exercise.  Recommend at least 30 minutes daily, 5 days per week of walking, jogging, biking, swimming, YouTube/Pinterest workout videos. Follow Mediterranean diet. Please schedule follow-up in 6 months with a fasting lab appt the week prior. Continue to social distance and wear a mask when in public.  Elevated LDL cholesterol level The 69-GEXB10-year ASCVD risk score James George(Goff DC Jr., et al., 2013) is: 4.9% Values used to calculate the score:  Age: 89 years  Sex: Male  Is Non-Hispanic African American: No  Diabetic: No  Tobacco smoker: No  Systolic Blood Pressure: 121 mmHg  Is BP treated: No  HDL Cholesterol: 45 mg/dL  Total Cholesterol: 284269 mg/dL [id number] Lengthy discussion on how to normalize lipids, encouraged lifestyle, will re-check in 6 months If not dramatically improved will discuss statin therpy    FOLLOW-UP:  Return in about 6 months (around 10/17/2019) for Regular Follow Up, Hypercholestermia.

## 2019-04-17 ENCOUNTER — Ambulatory Visit (INDEPENDENT_AMBULATORY_CARE_PROVIDER_SITE_OTHER): Payer: 59 | Admitting: Adult Health

## 2019-04-17 ENCOUNTER — Encounter: Payer: Self-pay | Admitting: Adult Health

## 2019-04-17 VITALS — BP 110/64 | HR 62 | Temp 98.7°F | Ht 68.25 in | Wt 184.7 lb

## 2019-04-17 DIAGNOSIS — E7800 Pure hypercholesterolemia, unspecified: Secondary | ICD-10-CM

## 2019-04-17 DIAGNOSIS — Z0000 Encounter for general adult medical examination without abnormal findings: Secondary | ICD-10-CM | POA: Diagnosis not present

## 2019-04-17 NOTE — Patient Instructions (Addendum)
Preventive Care for Adults, Male A healthy lifestyle and preventive care can promote health and wellness. Preventive health guidelines for men include the following key practices:  A routine yearly physical is a good way to check with your health care provider about your health and preventative screening. It is a chance to share any concerns and updates on your health and to receive a thorough exam.  Visit your dentist for a routine exam and preventative care every 6 months. Brush your teeth twice a day and floss once a day. Good oral hygiene prevents tooth decay and gum disease.  The frequency of eye exams is based on your age, health, family medical history, use of contact lenses, and other factors. Follow your health care provider's recommendations for frequency of eye exams.  Eat a healthy diet. Foods such as vegetables, fruits, whole grains, low-fat dairy products, and lean protein foods contain the nutrients you need without too many calories. Decrease your intake of foods high in solid fats, added sugars, and salt. Eat the right amount of calories for you.Get information about a proper diet from your health care provider, if necessary.  Regular physical exercise is one of the most important things you can do for your health. Most adults should get at least 150 minutes of moderate-intensity exercise (any activity that increases your heart rate and causes you to sweat) each week. In addition, most adults need muscle-strengthening exercises on 2 or more days a week.  Maintain a healthy weight. The body mass index (BMI) is a screening tool to identify possible weight problems. It provides an estimate of body fat based on height and weight. Your health care provider can find your BMI and can help you achieve or maintain a healthy weight.For adults 20 years and older:  A BMI below 18.5 is considered underweight.  A BMI of 18.5 to 24.9 is normal.  A BMI of 25 to 29.9 is considered  overweight.  A BMI of 30 and above is considered obese.  Maintain normal blood lipids and cholesterol levels by exercising and minimizing your intake of saturated fat. Eat a balanced diet with plenty of fruit and vegetables. Blood tests for lipids and cholesterol should begin at age 55 and be repeated every 5 years. If your lipid or cholesterol levels are high, you are over 50, or you are at high risk for heart disease, you may need your cholesterol levels checked more frequently.Ongoing high lipid and cholesterol levels should be treated with medicines if diet and exercise are not working.  If you smoke, find out from your health care provider how to quit. If you do not use tobacco, do not start.  Lung cancer screening is recommended for adults aged 90-80 years who are at high risk for developing lung cancer because of a history of smoking. A yearly low-dose CT scan of the lungs is recommended for people who have at least a 30-pack-year history of smoking and are a current smoker or have quit within the past 15 years. A pack year of smoking is smoking an average of 1 pack of cigarettes a day for 1 year (for example: 1 pack a day for 30 years or 2 packs a day for 15 years). Yearly screening should continue until the smoker has stopped smoking for at least 15 years. Yearly screening should be stopped for people who develop a health problem that would prevent them from having lung cancer treatment.  If you choose to drink alcohol, do not have more  than 2 drinks per day. One drink is considered to be 12 ounces (355 mL) of beer, 5 ounces (148 mL) of wine, or 1.5 ounces (44 mL) of liquor.  Avoid use of street drugs. Do not share needles with anyone. Ask for help if you need support or instructions about stopping the use of drugs.  High blood pressure causes heart disease and increases the risk of stroke. Your blood pressure should be checked at least every 1-2 years. Ongoing high blood pressure should be  treated with medicines, if weight loss and exercise are not effective.  If you are 34-90 years old, ask your health care provider if you should take aspirin to prevent heart disease.  Diabetes screening is done by taking a blood sample to check your blood glucose level after you have not eaten for a certain period of time (fasting). If you are not overweight and you do not have risk factors for diabetes, you should be screened once every 3 years starting at age 35. If you are overweight or obese and you are 70-84 years of age, you should be screened for diabetes every year as part of your cardiovascular risk assessment.  Colorectal cancer can be detected and often prevented. Most routine colorectal cancer screening begins at the age of 18 and continues through age 69. However, your health care provider may recommend screening at an earlier age if you have risk factors for colon cancer. On a yearly basis, your health care provider may provide home test kits to check for hidden blood in the stool. Use of a small camera at the end of a tube to directly examine the colon (sigmoidoscopy or colonoscopy) can detect the earliest forms of colorectal cancer. Talk to your health care provider about this at age 71, when routine screening begins. Direct exam of the colon should be repeated every 5-10 years through age 18, unless early forms of precancerous polyps or small growths are found.  People who are at an increased risk for hepatitis B should be screened for this virus. You are considered at high risk for hepatitis B if:  You were born in a country where hepatitis B occurs often. Talk with your health care provider about which countries are considered high risk.  Your parents were born in a high-risk country and you have not received a shot to protect against hepatitis B (hepatitis B vaccine).  You have HIV or AIDS.  You use needles to inject street drugs.  You live with, or have sex with, someone who  has hepatitis B.  You are a man who has sex with other men (MSM).  You get hemodialysis treatment.  You take certain medicines for conditions such as cancer, organ transplantation, and autoimmune conditions.  Hepatitis C blood testing is recommended for all people born from [birth year] through 1965 and any individual with known risks for hepatitis C.  Practice safe sex. Use condoms and avoid high-risk sexual practices to reduce the spread of sexually transmitted infections (STIs). STIs include gonorrhea, chlamydia, syphilis, trichomonas, herpes, HPV, and human immunodeficiency virus (HIV). Herpes, HIV, and HPV are viral illnesses that have no cure. They can result in disability, cancer, and death.  If you are a man who has sex with other men, you should be screened at least once per year for:  HIV.  Urethral, rectal, and pharyngeal infection of gonorrhea, chlamydia, or both.  If you are at risk of being infected with HIV, it is recommended that you take a  prescription medicine daily to prevent HIV infection. This is called preexposure prophylaxis (PrEP). You are considered at risk if:  You are a man who has sex with other men (MSM) and have other risk factors.  You are a heterosexual man, are sexually active, and are at increased risk for HIV infection.  You take drugs by injection.  You are sexually active with a partner who has HIV.  Talk with your health care provider about whether you are at high risk of being infected with HIV. If you choose to begin PrEP, you should first be tested for HIV. You should then be tested every 3 months for as long as you are taking PrEP.  A one-time screening for abdominal aortic aneurysm (AAA) and surgical repair of large AAAs by ultrasound are recommended for men ages 44 to 66 years who are current or former smokers.  Healthy men should no longer receive prostate-specific antigen (PSA) blood tests as part of routine cancer screening. Talk with your health  care provider about prostate cancer screening.  Testicular cancer screening is not recommended for adult males who have no symptoms. Screening includes self-exam, a health care provider exam, and other screening tests. Consult with your health care provider about any symptoms you have or any concerns you have about testicular cancer.  Use sunscreen. Apply sunscreen liberally and repeatedly throughout the day. You should seek shade when your shadow is shorter than you. Protect yourself by wearing long sleeves, pants, a wide-brimmed hat, and sunglasses year round, whenever you are outdoors.  Once a month, do a whole-body skin exam, using a mirror to look at the skin on your back. Tell your health care provider about new moles, moles that have irregular borders, moles that are larger than a pencil eraser, or moles that have changed in shape or color.  Stay current with required vaccines (immunizations).  Influenza vaccine. All adults should be immunized every year.  Tetanus, diphtheria, and acellular pertussis (Td, Tdap) vaccine. An adult who has not previously received Tdap or who does not know his vaccine status should receive 1 dose of Tdap. This initial dose should be followed by tetanus and diphtheria toxoids (Td) booster doses every 10 years. Adults with an unknown or incomplete history of completing a 3-dose immunization series with Td-containing vaccines should begin or complete a primary immunization series including a Tdap dose. Adults should receive a Td booster every 10 years.  Varicella vaccine. An adult without evidence of immunity to varicella should receive 2 doses or a second dose if he has previously received 1 dose.  Human papillomavirus (HPV) vaccine. Males aged 11-21 years who have not received the vaccine previously should receive the 3-dose series. Males aged 22-26 years may be immunized. Immunization is recommended through the age of 23 years for any male who has sex with males  and did not get any or all doses earlier. Immunization is recommended for any person with an immunocompromised condition through the age of 72 years if he did not get any or all doses earlier. During the 3-dose series, the second dose should be obtained 4-8 weeks after the first dose. The third dose should be obtained 24 weeks after the first dose and 16 weeks after the second dose.  Zoster vaccine. One dose is recommended for adults aged 23 years or older unless certain conditions are present.  Measles, mumps, and rubella (MMR) vaccine. Adults born before [birth year] generally are considered immune to measles and mumps. Adults born in [birth year]  or later should have 1 or more doses of MMR vaccine unless there is a contraindication to the vaccine or there is laboratory evidence of immunity to each of the three diseases. A routine second dose of MMR vaccine should be obtained at least 28 days after the first dose for students attending postsecondary schools, health care workers, or international travelers. People who received inactivated measles vaccine or an unknown type of measles vaccine during 1963-1967 should receive 2 doses of MMR vaccine. People who received inactivated mumps vaccine or an unknown type of mumps vaccine before 1979 and are at high risk for mumps infection should consider immunization with 2 doses of MMR vaccine. Unvaccinated health care workers born before [birth year] who lack laboratory evidence of measles, mumps, or rubella immunity or laboratory confirmation of disease should consider measles and mumps immunization with 2 doses of MMR vaccine or rubella immunization with 1 dose of MMR vaccine.  Pneumococcal 13-valent conjugate (PCV13) vaccine. When indicated, a person who is uncertain of his immunization history and has no record of immunization should receive the PCV13 vaccine. All adults 9 years of age and older should receive this vaccine. An adult aged 69 years or older who has certain medical  conditions and has not been previously immunized should receive 1 dose of PCV13 vaccine. This PCV13 should be followed with a dose of pneumococcal polysaccharide (PPSV23) vaccine. Adults who are at high risk for pneumococcal disease should obtain the PPSV23 vaccine at least 8 weeks after the dose of PCV13 vaccine. Adults older than 50 years of age who have normal immune system function should obtain the PPSV23 vaccine dose at least 1 year after the dose of PCV13 vaccine.  Pneumococcal polysaccharide (PPSV23) vaccine. When PCV13 is also indicated, PCV13 should be obtained first. All adults aged 79 years and older should be immunized. An adult younger than age 43 years who has certain medical conditions should be immunized. Any person who resides in a nursing home or long-term care facility should be immunized. An adult smoker should be immunized. People with an immunocompromised condition and certain other conditions should receive both PCV13 and PPSV23 vaccines. People with human immunodeficiency virus (HIV) infection should be immunized as soon as possible after diagnosis. Immunization during chemotherapy or radiation therapy should be avoided. Routine use of PPSV23 vaccine is not recommended for American Indians, [place] Natives, or people younger than 65 years unless there are medical conditions that require PPSV23 vaccine. When indicated, people who have unknown immunization and have no record of immunization should receive PPSV23 vaccine. One-time revaccination 5 years after the first dose of PPSV23 is recommended for people aged 19-64 years who have chronic kidney failure, nephrotic syndrome, asplenia, or immunocompromised conditions. People who received 1-2 doses of PPSV23 before age 70 years should receive another dose of PPSV23 vaccine at age 79 years or later if at least 5 years have passed since the previous dose. Doses of PPSV23 are not needed for people immunized with PPSV23 at or after age 55  years.  Meningococcal vaccine. Adults with asplenia or persistent complement component deficiencies should receive 2 doses of quadrivalent meningococcal conjugate (MenACWY-D) vaccine. The doses should be obtained at least 2 months apart. Microbiologists working with certain meningococcal bacteria, [employer] recruits, people at risk during an outbreak, and people who travel to or live in countries with a high rate of meningitis should be immunized. A first-year college student up through age 64 years who is living in a residence hall should receive a  dose if he did not receive a dose on or after his 16th birthday. Adults who have certain high-risk conditions should receive one or more doses of vaccine.  Hepatitis A vaccine. Adults who wish to be protected from this disease, have chronic liver disease, work with hepatitis A-infected animals, work in hepatitis A research labs, or travel to or work in countries with a high rate of hepatitis A should be immunized. Adults who were previously unvaccinated and who anticipate close contact with an international adoptee during the first 60 days after arrival in the [place] from a country with a high rate of hepatitis A should be immunized.  Hepatitis B vaccine. Adults should be immunized if they wish to be protected from this disease, are under age 34 years and have diabetes, have chronic liver disease, have had more than one sex partner in the past 6 months, may be exposed to blood or other infectious body fluids, are household contacts or sex partners of hepatitis B positive people, are clients or workers in certain care facilities, or travel to or work in countries with a high rate of hepatitis B.  Haemophilus influenzae type b (Hib) vaccine. A previously unvaccinated person with asplenia or sickle cell disease or having a scheduled splenectomy should receive 1 dose of Hib vaccine. Regardless of previous immunization, a recipient of a hematopoietic stem cell  transplant should receive a 3-dose series 6-12 months after his successful transplant. Hib vaccine is not recommended for adults with HIV infection. Preventive Service / Frequency Ages 77 to 55  Blood pressure check.** / Every 3-5 years.  Lipid and cholesterol check.** / Every 5 years beginning at age 66.  Hepatitis C blood test.** / For any individual with known risks for hepatitis C.  Skin self-exam. / Monthly.  Influenza vaccine. / Every year.  Tetanus, diphtheria, and acellular pertussis (Tdap, Td) vaccine.** / Consult your health care provider. 1 dose of Td every 10 years.  Varicella vaccine.** / Consult your health care provider.  HPV vaccine. / 3 doses over 6 months, if 45 or younger.  Measles, mumps, rubella (MMR) vaccine.** / You need at least 1 dose of MMR if you were born in 1957 or later. You may also need a second dose.  Pneumococcal 13-valent conjugate (PCV13) vaccine.** / Consult your health care provider.  Pneumococcal polysaccharide (PPSV23) vaccine.** / 1 to 2 doses if you smoke cigarettes or if you have certain conditions.  Meningococcal vaccine.** / 1 dose if you are age 81 to 79 years and a Market researcher living in a residence hall, or have one of several medical conditions. You may also need additional booster doses.  Hepatitis A vaccine.** / Consult your health care provider.  Hepatitis B vaccine.** / Consult your health care provider.  Haemophilus influenzae type b (Hib) vaccine.** / Consult your health care provider. Ages 6 to 58  Blood pressure check.** / Every year.  Lipid and cholesterol check.** / Every 5 years beginning at age 89.  Lung cancer screening. / Every year if you are aged 84-80 years and have a 30-pack-year history of smoking and currently smoke or have quit within the past 15 years. Yearly screening is stopped once you have quit smoking for at least 15 years or develop a health problem that would prevent you from having  lung cancer treatment.  Fecal occult blood test (FOBT) of stool. / Every year beginning at age 90 and continuing until age 73. You may not have to do  this test if you get a colonoscopy every 10 years.  Flexible sigmoidoscopy** or colonoscopy.** / Every 5 years for a flexible sigmoidoscopy or every 10 years for a colonoscopy beginning at age 50 and continuing until age 75.  Hepatitis C blood test.** / For all people born from 1945 through 1965 and any individual with known risks for hepatitis C.  Skin self-exam. / Monthly.  Influenza vaccine. / Every year.  Tetanus, diphtheria, and acellular pertussis (Tdap/Td) vaccine.** / Consult your health care provider. 1 dose of Td every 10 years.  Varicella vaccine.** / Consult your health care provider.  Zoster vaccine.** / 1 dose for adults aged 60 years or older.  Measles, mumps, rubella (MMR) vaccine.** / You need at least 1 dose of MMR if you were born in 1957 or later. You may also need a second dose.  Pneumococcal 13-valent conjugate (PCV13) vaccine.** / Consult your health care provider.  Pneumococcal polysaccharide (PPSV23) vaccine.** / 1 to 2 doses if you smoke cigarettes or if you have certain conditions.  Meningococcal vaccine.** / Consult your health care provider.  Hepatitis A vaccine.** / Consult your health care provider.  Hepatitis B vaccine.** / Consult your health care provider.  Haemophilus influenzae type b (Hib) vaccine.** / Consult your health care provider. Ages 65 and over  Blood pressure check.** / Every year.  Lipid and cholesterol check.**/ Every 5 years beginning at age 20.  Lung cancer screening. / Every year if you are aged 55-80 years and have a 30-pack-year history of smoking and currently smoke or have quit within the past 15 years. Yearly screening is stopped once you have quit smoking for at least 15 years or develop a health problem that would prevent you from having lung cancer treatment.  Fecal  occult blood test (FOBT) of stool. / Every year beginning at age 50 and continuing until age 75. You may not have to do this test if you get a colonoscopy every 10 years.  Flexible sigmoidoscopy** or colonoscopy.** / Every 5 years for a flexible sigmoidoscopy or every 10 years for a colonoscopy beginning at age 50 and continuing until age 75.  Hepatitis C blood test.** / For all people born from 1945 through 1965 and any individual with known risks for hepatitis C.  Abdominal aortic aneurysm (AAA) screening.** / A one-time screening for ages 65 to 75 years who are current or former smokers.  Skin self-exam. / Monthly.  Influenza vaccine. / Every year.  Tetanus, diphtheria, and acellular pertussis (Tdap/Td) vaccine.** / 1 dose of Td every 10 years.  Varicella vaccine.** / Consult your health care provider.  Zoster vaccine.** / 1 dose for adults aged 60 years or older.  Pneumococcal 13-valent conjugate (PCV13) vaccine.** / 1 dose for all adults aged 65 years and older.  Pneumococcal polysaccharide (PPSV23) vaccine.** / 1 dose for all adults aged 65 years and older.  Meningococcal vaccine.** / Consult your health care provider.  Hepatitis A vaccine.** / Consult your health care provider.  Hepatitis B vaccine.** / Consult your health care provider.  Haemophilus influenzae type b (Hib) vaccine.** / Consult your health care provider. **Family history and personal history of risk and conditions may change your health care provider's recommendations.   This information is not intended to replace advice given to you by your health care provider. Make sure you discuss any questions you have with your health care provider.   Document Released: 12/22/2001 Document Revised: 11/16/2014 Document Reviewed: 03/23/2011 Elsevier Interactive Patient Education 2016   [place] refers to food and lifestyle choices that are based on the traditions of  countries located on the [employer]. This way of eating has been shown to help prevent certain conditions and improve outcomes for people who have chronic diseases, like kidney disease and heart disease. What are tips for following this plan? Lifestyle  Cook and eat meals together with your family, when possible.  Drink enough fluid to keep your urine clear or pale yellow.  Be physically active every day. This includes: ? Aerobic exercise like running or swimming. ? Leisure activities like gardening, walking, or housework.  Get 7-8 hours of sleep each night.  If recommended by your health care provider, drink red wine in moderation. This means 1 glass a day for nonpregnant women and 2 glasses a day for men. A glass of wine equals 5 oz (150 mL). Reading food labels   Check the serving size of packaged foods. For foods such as rice and pasta, the serving size refers to the amount of cooked product, not dry.  Check the total fat in packaged foods. Avoid foods that have saturated fat or trans fats.  Check the ingredients list for added sugars, such as corn syrup. Shopping  At the grocery store, buy most of your food from the areas near the walls of the store. This includes: ? Fresh fruits and vegetables (produce). ? Grains, beans, nuts, and seeds. Some of these may be available in unpackaged forms or large amounts (in bulk). ? Fresh seafood. ? Poultry and eggs. ? Low-fat dairy products.  Buy whole ingredients instead of prepackaged foods.  Buy fresh fruits and vegetables in-season from local farmers markets.  Buy frozen fruits and vegetables in resealable bags.  If you do not have access to quality fresh seafood, buy precooked frozen shrimp or canned fish, such as tuna, salmon, or sardines.  Buy small amounts of raw or cooked vegetables, salads, or olives from the deli or salad bar at your store.  Stock your pantry so you always have certain foods on hand, such as olive  oil, canned tuna, canned tomatoes, rice, pasta, and beans. Cooking  Cook foods with extra-virgin olive oil instead of using butter or other vegetable oils.  Have meat as a side dish, and have vegetables or grains as your main dish. This means having meat in small portions or adding small amounts of meat to foods like pasta or stew.  Use beans or vegetables instead of meat in common dishes like chili or lasagna.  Experiment with different cooking methods. Try roasting or broiling vegetables instead of steaming or sauteing them.  Add frozen vegetables to soups, stews, pasta, or rice.  Add nuts or seeds for added healthy fat at each meal. You can add these to yogurt, salads, or vegetable dishes.  Marinate fish or vegetables using olive oil, lemon juice, garlic, and fresh herbs. Meal planning   Plan to eat 1 vegetarian meal one day each week. Try to work up to 2 vegetarian meals, if possible.  Eat seafood 2 or more times a week.  Have healthy snacks readily available, such as: ? Vegetable sticks with hummus. ? [place] yogurt. ? Fruit and nut trail mix.  Eat balanced meals throughout the week. This includes: ? Fruit: 2-3 servings a day ? Vegetables: 4-5 servings a day ? Low-fat dairy: 2 servings a day ? Fish, poultry, or lean meat: 1 serving a day ? Beans and legumes: 2  or more servings a week ? Nuts and seeds: 1-2 servings a day ? Whole grains: 6-8 servings a day ? Extra-virgin olive oil: 3-4 servings a day  Limit red meat and sweets to only a few servings a month What are my food choices?  Mediterranean diet ? Recommended ? Grains: Whole-grain pasta. Brown rice. Bulgar wheat. Polenta. Couscous. Whole-wheat bread. [place]. ? Vegetables: Artichokes. Beets. Broccoli. Cabbage. Carrots. Eggplant. Green beans. Chard. Kale. Spinach. Onions. Leeks. Peas. Squash. Tomatoes. Peppers. Radishes. ? Fruits: Apples. Apricots. Avocado. Berries. Bananas. Cherries. Dates. Figs. Grapes.  Lemons. Melon. Oranges. Peaches. Plums. Pomegranate. ? Meats and other protein foods: Beans. Almonds. Sunflower seeds. Pine nuts. Peanuts. [place]. Salmon. Scallops. Shrimp. [place]. Tilapia. Clams. Oysters. Eggs. ? Dairy: Low-fat milk. Cheese. Greek yogurt. ? Beverages: Water. Red wine. Herbal tea. ? Fats and oils: Extra virgin olive oil. Avocado oil. Grape seed oil. ? Sweets and desserts: [place] yogurt with honey. Baked apples. Poached pears. Trail mix. ? Seasoning and other foods: Basil. Cilantro. Coriander. Cumin. Mint. Parsley. Sage. Rosemary. Tarragon. Garlic. Oregano. Thyme. Pepper. Balsalmic vinegar. Tahini. Hummus. Tomato sauce. Olives. Mushrooms. ? Limit these ? Grains: Prepackaged pasta or rice dishes. Prepackaged cereal with added sugar. ? Vegetables: Deep fried potatoes (french fries). ? Fruits: Fruit canned in syrup. ? Meats and other protein foods: Beef. Pork. Lamb. Poultry with skin. Hot dogs. Berniece Salines. ? Dairy: Ice cream. Sour cream. Whole milk. ? Beverages: Juice. Sugar-sweetened soft drinks. Beer. Liquor and spirits. ? Fats and oils: Butter. Canola oil. Vegetable oil. Beef fat (tallow). Lard. ? Sweets and desserts: Cookies. Cakes. Pies. Candy. ? Seasoning and other foods: Mayonnaise. Premade sauces and marinades. ? The items listed may not be a complete list. Talk with your dietitian about what dietary choices are right for you. Summary  The Mediterranean diet includes both food and lifestyle choices.  Eat a variety of fresh fruits and vegetables, beans, nuts, seeds, and whole grains.  Limit the amount of red meat and sweets that you eat.  Talk with your health care provider about whether it is safe for you to drink red wine in moderation. This means 1 glass a day for nonpregnant women and 2 glasses a day for men. A glass of wine equals 5 oz (150 mL). This information is not intended to replace advice given to you by your health care provider. Make sure you discuss any questions  you have with your health care provider. Document Released: 06/18/2016 Document Revised: 07/21/2016 Document Reviewed: 06/18/2016 Elsevier Interactive Patient Education  2019 [place] blood pressure and weight look good today. Labs are stable, with elevations in your cholesterol panel. To lower your total cholesterol, Triglycerides, and LDL (bad), and increase your HDL (good): Increase regular exercise.  Recommend at least 30 minutes daily, 5 days per week of walking, jogging, biking, swimming, YouTube/Pinterest workout videos. Follow Mediterranean diet. Please schedule follow-up in 6 months with a fasting lab appt the week prior. Continue to social distance and wear a mask when in public. GREAT TO SEE YOU!

## 2019-04-17 NOTE — Assessment & Plan Note (Signed)
The 10-year ASCVD risk score Mikey Bussing DC Brooke Bonito., et al., 2013) is: 4.9% Values used to calculate the score:  Age: 48 years  Sex: Male  Is Non-Hispanic African American: No  Diabetic: No  Tobacco smoker: No  Systolic Blood Pressure: 071 mmHg  Is BP treated: No  HDL Cholesterol: 45 mg/dL  Total Cholesterol: 269 mg/dL LDL-169 Lengthy discussion on how to normalize lipids, encouraged lifestyle, will re-check in 6 months If not dramatically improved will discuss statin therpy

## 2019-04-17 NOTE — Assessment & Plan Note (Signed)
  Your blood pressure and weight look good today. Labs are stable, with elevations in your cholesterol panel. To lower your total cholesterol, Triglycerides, and LDL (bad), and increase your HDL (good): Increase regular exercise.  Recommend at least 30 minutes daily, 5 days per week of walking, jogging, biking, swimming, YouTube/Pinterest workout videos. Follow Mediterranean diet. Please schedule follow-up in 6 months with a fasting lab appt the week prior. Continue to social distance and wear a mask when in public.

## 2019-10-10 ENCOUNTER — Other Ambulatory Visit: Payer: Self-pay

## 2019-10-10 ENCOUNTER — Other Ambulatory Visit: Payer: 59

## 2019-10-10 DIAGNOSIS — E7800 Pure hypercholesterolemia, unspecified: Secondary | ICD-10-CM

## 2019-10-11 LAB — LIPID PANEL
Chol/HDL Ratio: 6.6 ratio — ABNORMAL HIGH (ref 0.0–5.0)
Cholesterol, Total: 259 mg/dL — ABNORMAL HIGH (ref 100–199)
HDL: 39 mg/dL — ABNORMAL LOW (ref >39)
LDL Chol Calc (NIH): 160 mg/dL — ABNORMAL HIGH (ref 0–99)
Triglycerides: 321 mg/dL — ABNORMAL HIGH (ref 0–149)
VLDL Cholesterol Cal: 60 mg/dL — ABNORMAL HIGH (ref 5–40)

## 2019-10-16 NOTE — Progress Notes (Signed)
Subjective:    Patient ID: James Cherry, male    DOB: 19-Jul-1971, 48 y.o.   MRN: [id number]  HPI::02/09/2019 OV: [name] is here to establish as a new pt. He is a pleasant 48 year old male. PMH:HLD, Tension HA (estimates 1 HA/months, easily treated with OTC NSAIDs). He is concerned about AAA. He reports paternal uncle passing away in his mid 1s from AAA rupture. He is unsure of his uncle's social hx. He denies tobacco use He is only 48 years old and his BP is excellent- 121/71, HR 57 He estimates to drink >80 oz water/day and is quite active- [place], [place] every other day springs/summer He reports eating beef daily and nightly ice cream eating He has not had CPE with fasting labs since 2014 He denies acute complaints/issues  04/17/2019 OV: [name] presents for CPE He continues to drink >80 oz water/day and has been walking 1/2- 2/3 mile each evening. Now that the weather has warmed up, he has started swimming in his pool. He continues to eat ice cream most nights of the week and red meat 3-4 times/week He continues to abstain from tobacco/vape/ETOH  Recent Labs 04/11/2019: TSH-WNL, 2.700 The 10-year ASCVD risk score Mikey Bussing DC Jr., et al., 2013) is: 4.9% Values used to calculate the score:  Age: 66 years  Sex: Male  Is Non-Hispanic African American: No  Diabetic: No  Tobacco smoker: No  Systolic Blood Pressure: 353 mmHg  Is BP treated: No  HDL Cholesterol: 45 mg/dL  Total Cholesterol: 269 mg/dL LDL-169 A1c-5.6, just under pre-diabetic range CMP-stable CBC-stable  Healthcare Maintenance: Immunizations-UTD 10/17/2019 OV: [name] is here for f/u- hyperlipidemia He has been consuming large amounts of sugar/cho and more red meat than usual. He has stopped all formal exercise that last 6 months. He denies tobacco/vape/ETOH use He denies first degree family hx of MI/CVA He is very reluctant to starting a  statin. Discussed really focusing on lifestyle and re-checking lipids in 2 months and if LDL not at least decreased by 40 points then he is agreeable to statin therapy (twice weekly-not QD).  10/10/2019 Labs: The 10-year ASCVD risk score Mikey Bussing DC Brooke Bonito., et al., 2013) is: 5%  Values used to calculate the score:   Age: 54 years   Sex: Male   Is Non-Hispanic African American: No   Diabetic: No   Tobacco smoker: No   Systolic Blood Pressure: 614 mmHg   Is BP treated: No   HDL Cholesterol: 39 mg/dL   Total Cholesterol: 259 mg/dL  LDL-160    Patient Care Team    Relationship Specialty Notifications Start End  Esaw Grandchild, NP PCP - General Family Medicine  02/09/19     Patient Active Problem List   Diagnosis Date Noted  . Healthcare maintenance 02/09/2019  . Elevated LDL cholesterol level 02/09/2019     Past Medical History:  Diagnosis Date  . Heart burn   . Neck pain      Past Surgical History:  Procedure Laterality Date  . SHOULDER SURGERY       Family History  Problem Relation Age of Onset  . Hyperlipidemia Mother   . Aortic aneurysm Father   . Aneurysm Father        iliac  . Stroke Maternal Grandfather      Social History   Substance and Sexual Activity  Drug Use No     Social History   Substance and Sexual Activity  Alcohol  Use No     Social History   Tobacco Use  Smoking Status Never Smoker  Smokeless Tobacco Never Used     Outpatient Encounter Medications as of 10/17/2019  Medication Sig  . ibuprofen (ADVIL,MOTRIN) 200 MG tablet Take 200 mg by mouth every 6 (six) hours as needed for pain.   No facility-administered encounter medications on file as of 10/17/2019.     Allergies: Demerol [meperidine] and Penicillins  Body mass index is 28.81 kg/m.  Blood pressure 122/71, pulse 64, temperature 98.1 F (36.7 C), temperature source Oral, height 5' 8.25" (1.734 m), weight 190 lb 14.4 oz (86.6 kg), SpO2 96  %.  Review of Systems  Constitutional: Positive for fatigue. Negative for activity change, appetite change, chills, diaphoresis, fever and unexpected weight change.  HENT: Negative for congestion.   Eyes: Negative for visual disturbance.  Respiratory: Negative for cough, chest tightness, shortness of breath, wheezing and stridor.   Cardiovascular: Negative for chest pain, palpitations and leg swelling.  Gastrointestinal: Negative for abdominal distention, abdominal pain, constipation, diarrhea, nausea and vomiting.  Endocrine: Negative for polydipsia, polyphagia and polyuria.  Musculoskeletal: Negative for arthralgias and myalgias.  Neurological: Negative for dizziness and headaches.  Hematological: Negative for adenopathy. Does not bruise/bleed easily.       Objective:   Physical Exam Constitutional:      General: He is not in acute distress.    Appearance: Normal appearance. He is normal weight. He is not ill-appearing, toxic-appearing or diaphoretic.  HENT:     Head: Normocephalic and atraumatic.  Eyes:     Extraocular Movements: Extraocular movements intact.     Conjunctiva/sclera: Conjunctivae normal.     Pupils: Pupils are equal, round, and reactive to light.  Cardiovascular:     Rate and Rhythm: Normal rate and regular rhythm.     Pulses: Normal pulses.     Heart sounds: Normal heart sounds. No murmur. No friction rub. No gallop.   Pulmonary:     Effort: Pulmonary effort is normal. No respiratory distress.     Breath sounds: Normal breath sounds. No stridor. No wheezing, rhonchi or rales.  Chest:     Chest wall: No tenderness.  Skin:    Capillary Refill: Capillary refill takes less than 2 seconds.  Neurological:     Mental Status: He is alert and oriented to person, place, and time.     Coordination: Coordination normal.  Psychiatric:        Mood and Affect: Mood normal.        Behavior: Behavior normal.        Thought Content: Thought content normal.         Judgment: Judgment normal.       Assessment & Plan:   1. Elevated LDL cholesterol level   2. On statin therapy     Elevated LDL cholesterol level Remain well hydrated. Follow Mediterranean diet Increase regular exercise.  Recommend at least 30 minutes daily, 5 days per week of walking, jogging, biking, swimming, YouTube/Pinterest workout videos. Please schedule fasting lab appt in 2 months and if LDL is not reduced by at least 40 points- will recommend statin therapy at that point. Continue to social distance and wear a mask when in public.    FOLLOW-UP:  Return in about 2 months (around 12/18/2019) for Fasting Labs.

## 2019-10-17 ENCOUNTER — Ambulatory Visit (INDEPENDENT_AMBULATORY_CARE_PROVIDER_SITE_OTHER): Payer: 59 | Admitting: Adult Health

## 2019-10-17 ENCOUNTER — Encounter: Payer: Self-pay | Admitting: Adult Health

## 2019-10-17 ENCOUNTER — Other Ambulatory Visit: Payer: Self-pay

## 2019-10-17 VITALS — BP 122/71 | HR 64 | Temp 98.1°F | Ht 68.25 in | Wt 190.9 lb

## 2019-10-17 DIAGNOSIS — Z79899 Other long term (current) drug therapy: Secondary | ICD-10-CM | POA: Diagnosis not present

## 2019-10-17 DIAGNOSIS — E7800 Pure hypercholesterolemia, unspecified: Secondary | ICD-10-CM | POA: Diagnosis not present

## 2019-10-17 NOTE — Assessment & Plan Note (Signed)
Remain well hydrated. Follow Mediterranean diet Increase regular exercise.  Recommend at least 30 minutes daily, 5 days per week of walking, jogging, biking, swimming, YouTube/Pinterest workout videos. Please schedule fasting lab appt in 2 months and if LDL is not reduced by at least 40 points- will recommend statin therapy at that point. Continue to social distance and wear a mask when in public.

## 2019-10-17 NOTE — Patient Instructions (Addendum)
Mediterranean Diet A Mediterranean diet refers to food and lifestyle choices that are based on the traditions of countries located on the [employer]. This way of eating has been shown to help prevent certain conditions and improve outcomes for people who have chronic diseases, like kidney disease and heart disease. What are tips for following this plan? Lifestyle  Cook and eat meals together with your family, when possible.  Drink enough fluid to keep your urine clear or pale yellow.  Be physically active every day. This includes: ? Aerobic exercise like running or swimming. ? Leisure activities like gardening, walking, or housework.  Get 7-8 hours of sleep each night.  If recommended by your health care provider, drink red wine in moderation. This means 1 glass a day for nonpregnant women and 2 glasses a day for men. A glass of wine equals 5 oz (150 mL). Reading food labels   Check the serving size of packaged foods. For foods such as rice and pasta, the serving size refers to the amount of cooked product, not dry.  Check the total fat in packaged foods. Avoid foods that have saturated fat or trans fats.  Check the ingredients list for added sugars, such as corn syrup. Shopping  At the grocery store, buy most of your food from the areas near the walls of the store. This includes: ? Fresh fruits and vegetables (produce). ? Grains, beans, nuts, and seeds. Some of these may be available in unpackaged forms or large amounts (in bulk). ? Fresh seafood. ? Poultry and eggs. ? Low-fat dairy products.  Buy whole ingredients instead of prepackaged foods.  Buy fresh fruits and vegetables in-season from local farmers markets.  Buy frozen fruits and vegetables in resealable bags.  If you do not have access to quality fresh seafood, buy precooked frozen shrimp or canned fish, such as tuna, salmon, or sardines.  Buy small amounts of raw or cooked vegetables, salads, or olives from  the deli or salad bar at your store.  Stock your pantry so you always have certain foods on hand, such as olive oil, canned tuna, canned tomatoes, rice, pasta, and beans. Cooking  Cook foods with extra-virgin olive oil instead of using butter or other vegetable oils.  Have meat as a side dish, and have vegetables or grains as your main dish. This means having meat in small portions or adding small amounts of meat to foods like pasta or stew.  Use beans or vegetables instead of meat in common dishes like chili or lasagna.  Experiment with different cooking methods. Try roasting or broiling vegetables instead of steaming or sauteing them.  Add frozen vegetables to soups, stews, pasta, or rice.  Add nuts or seeds for added healthy fat at each meal. You can add these to yogurt, salads, or vegetable dishes.  Marinate fish or vegetables using olive oil, lemon juice, garlic, and fresh herbs. Meal planning   Plan to eat 1 vegetarian meal one day each week. Try to work up to 2 vegetarian meals, if possible.  Eat seafood 2 or more times a week.  Have healthy snacks readily available, such as: ? Vegetable sticks with hummus. ? [place] yogurt. ? Fruit and nut trail mix.  Eat balanced meals throughout the week. This includes: ? Fruit: 2-3 servings a day ? Vegetables: 4-5 servings a day ? Low-fat dairy: 2 servings a day ? Fish, poultry, or lean meat: 1 serving a day ? Beans and legumes: 2 or more servings a week ?  Nuts and seeds: 1-2 servings a day ? Whole grains: 6-8 servings a day ? Extra-virgin olive oil: 3-4 servings a day  Limit red meat and sweets to only a few servings a month What are my food choices?  Mediterranean diet ? Recommended  Grains: Whole-grain pasta. Brown rice. Bulgar wheat. Polenta. Couscous. Whole-wheat bread. [place].  Vegetables: Artichokes. Beets. Broccoli. Cabbage. Carrots. Eggplant. Green beans. Chard. Kale. Spinach. Onions. Leeks. Peas. Squash.  Tomatoes. Peppers. Radishes.  Fruits: Apples. Apricots. Avocado. Berries. Bananas. Cherries. Dates. Figs. Grapes. Lemons. Melon. Oranges. Peaches. Plums. Pomegranate.  Meats and other protein foods: Beans. Almonds. Sunflower seeds. Pine nuts. Peanuts. [place]. Salmon. Scall[place]. Shrimp. [place]. Tilapia. Clams. Oysters. Eggs.  Dairy: Low-fat milk. Cheese. Greek yogurt.  Beverages: Water. Red wine. Herbal tea.  Fats and oils: Extra virgin olive oil. Avocado oil. Grape seed oil.  Sweets and desserts: [place] yogurt with honey. Baked apples. Poached pears. Trail mix.  Seasoning and other foods: Basil. Cilantro. Coriander. Cumin. Mint. Parsley. Sage. Rosemary. Tarragon. Garlic. Oregano. Thyme. Pepper. Balsalmic vinegar. Tahini. Hummus. Tomato sauce. Olives. Mushrooms. ? Limit these  Grains: Prepackaged pasta or rice dishes. Prepackaged cereal with added sugar.  Vegetables: Deep fried potatoes (french fries).  Fruits: Fruit canned in syrup.  Meats and other protein foods: Beef. Pork. Lamb. Poultry with skin. Hot dogs. [place] Salines.  Dairy: Ice cream. Sour cream. Whole milk.  Beverages: Juice. Sugar-sweetened soft drinks. Beer. Liquor and spirits.  Fats and oils: Butter. Canola oil. Vegetable oil. Beef fat (tallow). Lard.  Sweets and desserts: Cookies. Cakes. Pies. Candy.  Seasoning and other foods: Mayonnaise. Premade sauces and marinades. The items listed may not be a complete list. Talk with your dietitian about what dietary choices are right for you. Summary  The Mediterranean diet includes both food and lifestyle choices.  Eat a variety of fresh fruits and vegetables, beans, nuts, seeds, and whole grains.  Limit the amount of red meat and sweets that you eat.  Talk with your health care provider about whether it is safe for you to drink red wine in moderation. This means 1 glass a day for nonpregnant women and 2 glasses a day for men. A glass of wine equals 5 oz (150 mL). This information  is not intended to replace advice given to you by your health care provider. Make sure you discuss any questions you have with your health care provider. Document Released: 06/18/2016 Document Revised: 06/25/2016 Document Reviewed: 06/18/2016 Elsevier Patient Education  2020 [place] well hydrated. Follow Mediterranean diet Increase regular exercise.  Recommend at least 30 minutes daily, 5 days per week of walking, jogging, biking, swimming, YouTube/Pinterest workout videos. Please schedule fasting lab appt in 2 months and if LDL is not reduced by at least 40 points- will recommend statin therapy at that point. Continue to social distance and wear a mask when in public. GREAT TO SEE YOU!

## 2019-12-19 ENCOUNTER — Other Ambulatory Visit: Payer: 59

## 2020-01-09 ENCOUNTER — Telehealth: Payer: Self-pay | Admitting: Family Medicine

## 2020-01-09 NOTE — Telephone Encounter (Signed)
Reviewed notes and labs with Dr. Val Eagle and ASCVD risk is 5.9%-last checked 10/2019 Dr. Val Eagle recommends recheck 6 months from December 2020. Please cancel pt lab apt and reschedule in June 2021 for Lipid and ALT with OV 3 days later. AS, CMA

## 2020-01-10 ENCOUNTER — Other Ambulatory Visit: Payer: 59

## 2021-07-28 ENCOUNTER — Encounter: Payer: Self-pay | Admitting: Nurse Practitioner

## 2021-07-28 ENCOUNTER — Ambulatory Visit (INDEPENDENT_AMBULATORY_CARE_PROVIDER_SITE_OTHER): Payer: 59 | Admitting: Nurse Practitioner

## 2021-07-28 ENCOUNTER — Other Ambulatory Visit: Payer: Self-pay

## 2021-07-28 VITALS — BP 126/72 | HR 69 | Temp 98.1°F | Ht 68.0 in | Wt 192.3 lb

## 2021-07-28 DIAGNOSIS — L723 Sebaceous cyst: Secondary | ICD-10-CM | POA: Diagnosis not present

## 2021-07-28 DIAGNOSIS — Z0001 Encounter for general adult medical examination with abnormal findings: Secondary | ICD-10-CM | POA: Diagnosis not present

## 2021-07-28 DIAGNOSIS — Z6829 Body mass index (BMI) 29.0-29.9, adult: Secondary | ICD-10-CM

## 2021-07-28 NOTE — Progress Notes (Signed)
Established Patient Office Visit  Subjective:  Patient ID: James Cherry, male    DOB: June 17, 1971  Age: 50 y.o. MRN: [id number]  CC:  Chief Complaint  Patient presents with   Annual Exam    HPI James Cherry presents for annual wellness visit. Has some intermittent GERD symptoms. He feels like this is food related and is mostly at night. Will take TUMS when needed. Will sleep with head raised at 30 degrees. He is trying to avoid trigger foods and avoid eating later at night. These adjustments help with preventing acid reflux  Has a spot on the skin of his forehead, in between the eyebrows which he would like to have looked at. Sebaceous cyst at the base of left side of neck which needs to be looked at and removed.  The patient denies other concerns or complaints. He denies chest pain, chest pressure, or shortness of breath. He denies headaches or visual disturbances. He denies abdominal pain, nausea, vomiting, or changes in bowel or bladder habits.     Past Medical History:  Diagnosis Date   Heart burn    Neck pain     Past Surgical History:  Procedure Laterality Date   SHOULDER SURGERY      Family History  Problem Relation Age of Onset   Hyperlipidemia Mother    Aortic aneurysm Father    Aneurysm Father        iliac   Stroke Maternal Grandfather     Social History   Socioeconomic History   Marital status: Married    Spouse name: Not on file   Number of children: Not on file   Years of education: Not on file   Highest education level: Not on file  Occupational History   Not on file  Tobacco Use   Smoking status: Never   Smokeless tobacco: Never  Vaping Use   Vaping Use: Never used  Substance and Sexual Activity   Alcohol use: No   Drug use: No   Sexual activity: Yes  Other Topics Concern   Not on file  Social History Narrative   Not on file   Social Determinants of Health   Financial Resource Strain: Not on file  Food Insecurity: Not on file   Transportation Needs: Not on file  Physical Activity: Not on file  Stress: Not on file  Social Connections: Not on file  Intimate Partner Violence: Not on file    Outpatient Medications Prior to Visit  Medication Sig Dispense Refill   ibuprofen (ADVIL,MOTRIN) 200 MG tablet Take 200 mg by mouth every 6 (six) hours as needed for pain.     No facility-administered medications prior to visit.    Allergies  Allergen Reactions   Demerol [Meperidine] Nausea And Vomiting   Penicillins Swelling    ROS Review of Systems  Constitutional:  Negative for activity change, chills, fatigue and fever.  HENT:  Negative for congestion, postnasal drip, rhinorrhea, sinus pressure, sinus pain, sneezing and sore throat.   Eyes: Negative.   Respiratory:  Negative for cough, shortness of breath and wheezing.   Cardiovascular:  Negative for chest pain and palpitations.  Gastrointestinal:  Negative for constipation, diarrhea, nausea and vomiting.       Few episodes of GERD with heartburn - mostly at night.   Endocrine: Negative for cold intolerance, heat intolerance, polydipsia and polyuria.  Genitourinary:  Negative for dysuria, frequency and urgency.  Musculoskeletal:  Negative for back pain and myalgias.  Skin:  Negative  for rash.       There is sebaceous cyst of the base of the left side of his neck which has been getting bigger over the course of time.  Allergic/Immunologic: Negative for environmental allergies.  Neurological:  Negative for dizziness, weakness and headaches.  Psychiatric/Behavioral:  The patient is not nervous/anxious.      Objective:    Physical Exam Vitals and nursing note reviewed.  Constitutional:      Appearance: Normal appearance. He is well-developed.  HENT:     Head: Normocephalic and atraumatic.     Right Ear: Tympanic membrane, ear canal and external ear normal.     Left Ear: Tympanic membrane, ear canal and external ear normal.     Nose: Nose normal.      Mouth/Throat:     Mouth: Mucous membranes are moist.     Pharynx: Oropharynx is clear.  Eyes:     Extraocular Movements: Extraocular movements intact.     Conjunctiva/sclera: Conjunctivae normal.     Pupils: Pupils are equal, round, and reactive to light.  Neck:     Vascular: No carotid bruit.  Cardiovascular:     Rate and Rhythm: Normal rate and regular rhythm.     Pulses: Normal pulses.     Heart sounds: Normal heart sounds.  Pulmonary:     Effort: Pulmonary effort is normal.     Breath sounds: Normal breath sounds.  Abdominal:     General: Bowel sounds are normal.     Palpations: Abdomen is soft.     Tenderness: There is no abdominal tenderness.  Musculoskeletal:        General: Normal range of motion.     Cervical back: Normal range of motion and neck supple.  Lymphadenopathy:     Cervical: No cervical adenopathy.  Skin:    General: Skin is warm and dry.     Capillary Refill: Capillary refill takes less than 2 seconds.       Neurological:     General: No focal deficit present.     Mental Status: He is alert and oriented to person, place, and time.  Psychiatric:        Mood and Affect: Mood normal.        Behavior: Behavior normal.        Thought Content: Thought content normal.        Judgment: Judgment normal.   Today's Vitals   07/28/21 1313  BP: 126/72  Pulse: 69  Temp: 98.1 F (36.7 C)  SpO2: 97%  Weight: 192 lb 4.8 oz (87.2 kg)  Height: '5\' 8"'  (1.727 m)   Body mass index is 29.24 kg/m.   Wt Readings from Last 3 Encounters:  07/28/21 192 lb 4.8 oz (87.2 kg)  10/17/19 190 lb 14.4 oz (86.6 kg)  04/17/19 184 lb 11.2 oz (83.8 kg)     Health Maintenance Due  Topic Date Due   COVID-19 Vaccine (1) Never done   HIV Screening  Never done   Hepatitis C Screening  Never done   Zoster Vaccines- Shingrix (1 of 2) Never done   COLONOSCOPY (Pts 45-27yr Insurance coverage will need to be confirmed)  Never done   INFLUENZA VACCINE  Never done    There are  no preventive care reminders to display for this patient.  Lab Results  Component Value Date   TSH 1.930 07/31/2021   Lab Results  Component Value Date   WBC 5.1 07/31/2021   HGB 15.8 07/31/2021  HCT 46.0 07/31/2021   MCV 89 07/31/2021   PLT 167 07/31/2021   Lab Results  Component Value Date   NA 139 07/31/2021   K 4.5 07/31/2021   CO2 24 07/31/2021   GLUCOSE 98 07/31/2021   BUN 14 07/31/2021   CREATININE 1.04 07/31/2021   BILITOT 0.6 07/31/2021   ALKPHOS 62 07/31/2021   AST 27 07/31/2021   ALT 38 07/31/2021   PROT 6.4 07/31/2021   ALBUMIN 4.6 07/31/2021   CALCIUM 9.4 07/31/2021   EGFR 87 07/31/2021   Lab Results  Component Value Date   CHOL 232 (H) 07/31/2021   Lab Results  Component Value Date   HDL 40 07/31/2021   Lab Results  Component Value Date   LDLCALC 152 (H) 07/31/2021   Lab Results  Component Value Date   TRIG 217 (H) 07/31/2021   Lab Results  Component Value Date   CHOLHDL 5.8 (H) 07/31/2021   Lab Results  Component Value Date   HGBA1C 5.8 (H) 07/31/2021      Assessment & Plan:  1. Encounter for general adult medical examination with abnormal findings Annual wellness visit today.  Routine, fasting labs ordered.  Discussed results with patient and they are available.  2. Sebaceous cyst Sebaceous cyst present at the base of the left side of his neck.  Will refer to dermatology for further evaluation and treatment. - Ambulatory referral to Dermatology  3. Body mass index 29.0-29.9, adult Patient advised to limit calorie intake to 2000 cal or less.  He should consume a low-fat, low-cholesterol diet and incorporate exercise into his daily routine.  Problem List Items Addressed This Visit   None Visit Diagnoses     Encounter for general adult medical examination with abnormal findings    -  Primary   Sebaceous cyst       Relevant Orders   Ambulatory referral to Dermatology   Body mass index 29.0-29.9, adult          This note was  dictated using Dragon Voice Recognition Software. Rapid proofreading was performed to expedite the delivery of the information. Despite proofreading, phonetic errors will occur which are common with this voice recognition software. Please take this into consideration. If there are any concerns, please contact our office.     Follow-up: Return in about 1 year (around 07/28/2022) for Physical  - patient needs to have FBW with PSA in few days as part of his physical today. thanks. Marland Kitchen    Ronnell Freshwater, NP

## 2021-07-30 ENCOUNTER — Other Ambulatory Visit: Payer: Self-pay

## 2021-07-30 DIAGNOSIS — Z125 Encounter for screening for malignant neoplasm of prostate: Secondary | ICD-10-CM

## 2021-07-30 DIAGNOSIS — Z0000 Encounter for general adult medical examination without abnormal findings: Secondary | ICD-10-CM

## 2021-07-31 ENCOUNTER — Other Ambulatory Visit: Payer: Self-pay

## 2021-07-31 ENCOUNTER — Other Ambulatory Visit: Payer: 59

## 2021-07-31 DIAGNOSIS — Z0000 Encounter for general adult medical examination without abnormal findings: Secondary | ICD-10-CM

## 2021-07-31 DIAGNOSIS — Z125 Encounter for screening for malignant neoplasm of prostate: Secondary | ICD-10-CM

## 2021-08-01 LAB — TSH: TSH: 1.93 u[IU]/mL (ref 0.450–4.500)

## 2021-08-01 LAB — COMPREHENSIVE METABOLIC PANEL
ALT: 38 IU/L (ref 0–44)
AST: 27 IU/L (ref 0–40)
Albumin/Globulin Ratio: 2.6 — ABNORMAL HIGH (ref 1.2–2.2)
Albumin: 4.6 g/dL (ref 3.8–4.9)
Alkaline Phosphatase: 62 IU/L (ref 44–121)
BUN/Creatinine Ratio: 13 (ref 9–20)
BUN: 14 mg/dL (ref 6–24)
Bilirubin Total: 0.6 mg/dL (ref 0.0–1.2)
CO2: 24 mmol/L (ref 20–29)
Calcium: 9.4 mg/dL (ref 8.7–10.2)
Chloride: 102 mmol/L (ref 96–106)
Creatinine, Ser: 1.04 mg/dL (ref 0.76–1.27)
Globulin, Total: 1.8 g/dL (ref 1.5–4.5)
Glucose: 98 mg/dL (ref 65–99)
Potassium: 4.5 mmol/L (ref 3.5–5.2)
Sodium: 139 mmol/L (ref 134–144)
Total Protein: 6.4 g/dL (ref 6.0–8.5)
eGFR: 87 mL/min/{1.73_m2} (ref >59)

## 2021-08-01 LAB — LIPID PANEL
Chol/HDL Ratio: 5.8 ratio — ABNORMAL HIGH (ref 0.0–5.0)
Cholesterol, Total: 232 mg/dL — ABNORMAL HIGH (ref 100–199)
HDL: 40 mg/dL (ref >39)
LDL Chol Calc (NIH): 152 mg/dL — ABNORMAL HIGH (ref 0–99)
Triglycerides: 217 mg/dL — ABNORMAL HIGH (ref 0–149)
VLDL Cholesterol Cal: 40 mg/dL (ref 5–40)

## 2021-08-01 LAB — CBC
Hematocrit: 46 % (ref 37.5–51.0)
Hemoglobin: 15.8 g/dL (ref 13.0–17.7)
MCH: 30.5 pg (ref 26.6–33.0)
MCHC: 34.3 g/dL (ref 31.5–35.7)
MCV: 89 fL (ref 79–97)
Platelets: 167 10*3/uL (ref 150–450)
RBC: 5.18 x10E6/uL (ref 4.14–5.80)
RDW: 12.3 % (ref 11.6–15.4)
WBC: 5.1 10*3/uL (ref 3.4–10.8)

## 2021-08-01 LAB — HEMOGLOBIN A1C
Est. average glucose Bld gHb Est-mCnc: 120 mg/dL
Hgb A1c MFr Bld: 5.8 % — ABNORMAL HIGH (ref 4.8–5.6)

## 2021-08-01 LAB — PSA: Prostate Specific Ag, Serum: 1.7 ng/mL (ref 0.0–4.0)

## 2021-08-16 NOTE — Patient Instructions (Signed)

## 2021-08-25 ENCOUNTER — Encounter: Payer: Self-pay | Admitting: Nurse Practitioner

## 2021-08-25 NOTE — Progress Notes (Signed)
Lipid panel elevated though improved from check last year.  Hemoglobin A1c 5.8.  Recommend limiting fried and fatty foods in the diet and increasing exercise.  Recheck in 1 year.  MyChart message sent to patient.

## 2021-10-27 ENCOUNTER — Telehealth: Payer: Self-pay | Admitting: Nurse Practitioner

## 2021-10-27 NOTE — Telephone Encounter (Signed)
Patient called and stated he needed a referral to Dr. Conley Rolls at [facility] for a follow up and the only way for him to be seen there is by a referral from his PCP. Please advise. [phone]

## 2021-10-29 NOTE — Telephone Encounter (Signed)
I am ok doing the referral. But looking into care everywhere, he was just seen by them earlier this month. Is the referral needed for them to follow up of an emergency procedure? Just curious as I need to put in a diagnosis code. Thanks

## 2021-11-04 ENCOUNTER — Other Ambulatory Visit: Payer: Self-pay | Admitting: Nurse Practitioner

## 2021-11-04 DIAGNOSIS — K200 Eosinophilic esophagitis: Secondary | ICD-10-CM

## 2021-11-04 NOTE — Telephone Encounter (Signed)
Referral placed today for GI, Dr. Conley Rolls for further evaluation and treatment of eosinophilic esophagitis

## 2021-11-04 NOTE — Telephone Encounter (Signed)
Yes it was just for follow up from hospital

## 2021-11-04 NOTE — Progress Notes (Signed)
Referral placed today for GI, Dr. Le for further evaluation and treatment of eosinophilic esophagitis

## 2022-01-01 DIAGNOSIS — K219 Gastro-esophageal reflux disease without esophagitis: Secondary | ICD-10-CM | POA: Insufficient documentation

## 2023-01-25 DIAGNOSIS — K219 Gastro-esophageal reflux disease without esophagitis: Secondary | ICD-10-CM | POA: Diagnosis not present

## 2023-01-25 DIAGNOSIS — Z1211 Encounter for screening for malignant neoplasm of colon: Secondary | ICD-10-CM | POA: Diagnosis not present

## 2023-03-22 DIAGNOSIS — K648 Other hemorrhoids: Secondary | ICD-10-CM | POA: Diagnosis not present

## 2023-03-22 DIAGNOSIS — Z1211 Encounter for screening for malignant neoplasm of colon: Secondary | ICD-10-CM | POA: Diagnosis not present

## 2023-03-22 DIAGNOSIS — D120 Benign neoplasm of cecum: Secondary | ICD-10-CM | POA: Diagnosis not present

## 2023-03-22 DIAGNOSIS — D128 Benign neoplasm of rectum: Secondary | ICD-10-CM | POA: Diagnosis not present

## 2023-03-22 DIAGNOSIS — K635 Polyp of colon: Secondary | ICD-10-CM | POA: Diagnosis not present

## 2023-03-22 DIAGNOSIS — K5730 Diverticulosis of large intestine without perforation or abscess without bleeding: Secondary | ICD-10-CM | POA: Diagnosis not present

## 2023-03-22 DIAGNOSIS — K621 Rectal polyp: Secondary | ICD-10-CM | POA: Diagnosis not present

## 2023-05-04 ENCOUNTER — Encounter: Payer: Self-pay | Admitting: Family Medicine

## 2023-05-04 ENCOUNTER — Ambulatory Visit: Payer: 59 | Admitting: Family Medicine

## 2023-05-04 VITALS — BP 122/77 | HR 72 | Temp 97.7°F | Ht 68.0 in | Wt 186.0 lb

## 2023-05-04 DIAGNOSIS — A09 Infectious gastroenteritis and colitis, unspecified: Secondary | ICD-10-CM | POA: Diagnosis not present

## 2023-05-04 MED ORDER — CIPROFLOXACIN HCL 500 MG PO TABS: 500.0000 mg | ORAL_TABLET | Freq: Two times a day (BID) | ORAL | 0 refills | Status: AC

## 2023-05-04 NOTE — Patient Instructions (Signed)
It was nice to see you today,  We addressed the following topics today: - I have prescribed antibiotics for you to take twice a day for the next 3 days.  - if symptoms worsen or you develop side effects from the medication let us know.  - you can continue to use pepto bismol as needed for symptom relief as well.   Have a great day,  Frederic Jericho, MD

## 2023-05-04 NOTE — Assessment & Plan Note (Signed)
Because symptoms have not improved after over 5 days from onset will do 3 days of antibiotics.  500 mg Cipro twice daily x 3 days.  Patient advised to return to clinic if symptoms not resolved so that we may check stool sample for culture.

## 2023-05-04 NOTE — Progress Notes (Signed)
   Acute Office Visit  Subjective:     Patient ID: James Cherry, male    DOB: Feb 04, 1971, 52 y.o.   MRN: [id number]  Chief Complaint  Patient presents with   Acute Visit    Diarrhea     HPI Patient is in today for complaint of diarrhea.  Patient returned from a 7-day trip to [place] on Saturday.  2 days after starting his vacation in [place] he developed diarrhea on Monday night, the 17th.  Symptoms slightly improved Wednesday then worsened Thursday.  On Thursday he took a medicine called a benzyl.  Symptoms may have improved Friday but then now continues to have diarrhea at least 4 times a day.  No blood in the diarrhea..  Was dark after taking Pepto-Bismol.  Mild rectal soreness yesterday but otherwise normal.  No nausea or vomiting.  No fevers.  Patient drank bottled water but may have used ice with other drinks such as sodas.  His son also became sick during the trip.  Patient has an allergy to penicillin involving generalized swelling.  Takes Protonix for GERD, no other medications.  ROS      Objective:    BP 122/77   Pulse 72   Temp 97.7 F (36.5 C) (Oral)   Ht 5\' 8"  (1.727 m)   Wt 186 lb (84.4 kg)   SpO2 97%   BMI 28.28 kg/m    Physical Exam General: Alert, oriented Pulmonary: No respiratory stress Psych: Pleasant affect   No results found for any visits on 05/04/23.      Assessment & Plan:   Traveler's diarrhea Assessment & Plan: Because symptoms have not improved after over 5 days from onset will do 3 days of antibiotics.  500 mg Cipro twice daily x 3 days.  Patient advised to return to clinic if symptoms not resolved so that we may check stool sample for culture.   Other orders -     Ciprofloxacin HCl; Take 1 tablet (500 mg total) by mouth 2 (two) times daily for 3 days.  Dispense: 6 tablet; Refill: 0     Return if symptoms worsen or fail to improve.  Sandre Kitty, MD

## 2023-05-21 ENCOUNTER — Encounter: Payer: Self-pay | Admitting: *Deleted

## 2023-05-21 ENCOUNTER — Other Ambulatory Visit: Payer: Self-pay

## 2023-05-21 ENCOUNTER — Ambulatory Visit
Admission: EM | Admit: 2023-05-21 | Discharge: 2023-05-21 | Disposition: A | Discharge status: Home / Self Care | Payer: 59

## 2023-05-21 DIAGNOSIS — T63421A Toxic effect of venom of ants, accidental (unintentional), initial encounter: Secondary | ICD-10-CM | POA: Diagnosis not present

## 2023-05-21 DIAGNOSIS — T7840XA Allergy, unspecified, initial encounter: Secondary | ICD-10-CM

## 2023-05-21 DIAGNOSIS — L03116 Cellulitis of left lower limb: Secondary | ICD-10-CM | POA: Diagnosis not present

## 2023-05-21 MED ORDER — DOXYCYCLINE HYCLATE 100 MG PO CAPS: 100.0000 mg | ORAL_CAPSULE | Freq: Two times a day (BID) | ORAL | 0 refills | Status: DC

## 2023-05-21 MED ORDER — METHYLPREDNISOLONE ACETATE 80 MG/ML IJ SUSP
80.0000 mg | Freq: Once | INTRAMUSCULAR | Status: AC
  Administered 2023-05-21: 80 mg via INTRAMUSCULAR

## 2023-05-21 NOTE — ED Provider Notes (Signed)
[facility]    CSN: [id number] Arrival date & time: 05/21/23  1305      History   Chief Complaint Chief Complaint  Patient presents with   Insect Bite    HPI James Cherry is a 52 y.o. male.   Patient presents today with concern of reaction to fire ant bite that occurred a few days ago.  Patient states that the redness and swelling seems to be spreading and has had some lymph node swelling in his left groin.  Denies fever, body aches, chills, nausea, vomiting.  Patient reports that he has been taking some leftover azithromycin with some improvement in symptoms.  Denies feelings of throat closing or shortness of breath.     Past Medical History:  Diagnosis Date   Heart burn    Neck pain     Patient Active Problem List   Diagnosis Date Noted   Traveler's diarrhea 05/04/2023   Healthcare maintenance 02/09/2019   Elevated LDL cholesterol level 02/09/2019    Past Surgical History:  Procedure Laterality Date   SHOULDER SURGERY         Home Medications    Prior to Admission medications   Medication Sig Start Date End Date Taking? Authorizing Provider  Azithromycin (ZITHROMAX PO) Take by mouth.   Yes [provider]  doxycycline (VIBRAMYCIN) 100 MG capsule Take 1 capsule (100 mg total) by mouth 2 (two) times daily. 05/21/23  Yes Miken Stecher, Rolly Salter E, FNP  PANTOPRAZOLE SODIUM PO Take by mouth.   Yes [provider]  ibuprofen (ADVIL,MOTRIN) 200 MG tablet Take 200 mg by mouth every 6 (six) hours as needed for pain.    [provider]    Family History Family History  Problem Relation Age of Onset   Hyperlipidemia Mother    Aortic aneurysm Father    Aneurysm Father        iliac   Stroke Maternal Grandfather     Social History Social History   Tobacco Use   Smoking status: Never   Smokeless tobacco: Never  Vaping Use   Vaping status: Never Used  Substance Use Topics   Alcohol use: Yes    Comment: occasionally   Drug  use: No     Allergies   Demerol [meperidine] and Penicillins   Review of Systems Review of Systems Per HPI  Physical Exam Triage Vital Signs ED Triage Vitals  Encounter Vitals Group     BP 05/21/23 1356 128/72     Systolic BP Percentile --      Diastolic BP Percentile --      Pulse Rate 05/21/23 1356 60     Resp 05/21/23 1356 16     Temp 05/21/23 1356 98.3 F (36.8 C)     Temp Source 05/21/23 1356 Oral     SpO2 05/21/23 1356 96 %     Weight --      Height --      Head Circumference --      Peak Flow --      Pain Score 05/21/23 1358 0     Pain Loc --      Pain Education --      Exclude from Growth Chart --    No data found.  Updated Vital Signs BP 128/72   Pulse 60   Temp 98.3 F (36.8 C) (Oral)   Resp 16   SpO2 96%   Visual Acuity Right Eye Distance:   Left Eye Distance:   Bilateral Distance:  Right Eye Near:   Left Eye Near:    Bilateral Near:     Physical Exam Constitutional:      General: He is not in acute distress.    Appearance: Normal appearance. He is not toxic-appearing or diaphoretic.  HENT:     Head: Normocephalic and atraumatic.  Eyes:     Extraocular Movements: Extraocular movements intact.     Conjunctiva/sclera: Conjunctivae normal.  Pulmonary:     Effort: Pulmonary effort is normal.  Lymphadenopathy:     Lower Body: No right inguinal adenopathy. No left inguinal adenopathy.  Skin:    Comments: Patient has approximately 0.5 inch in diameter indurated area that is erythematous and mildly swollen present to left anterior shin.  Patient does have a little bit of surrounding swelling noted that extends slightly up to knee.  Neurological:     General: No focal deficit present.     Mental Status: He is alert and oriented to person, place, and time. Mental status is at baseline.  Psychiatric:        Mood and Affect: Mood normal.        Behavior: Behavior normal.        Thought Content: Thought content normal.        Judgment:  Judgment normal.      UC Treatments / Results  Labs (all labs ordered are listed, but only abnormal results are displayed) Labs Reviewed  BASIC METABOLIC PANEL  CBC    EKG   Radiology No results found.  Procedures Procedures (including critical care time)  Medications Ordered in UC Medications  methylPREDNISolone acetate (DEPO-MEDROL) injection 80 mg (80 mg Intramuscular Given 05/21/23 1418)    Initial Impression / Assessment and Plan / UC Course  I have reviewed the triage vital signs and the nursing notes.  Pertinent labs & imaging results that were available during my care of the patient were reviewed by me and considered in my medical decision making (see chart for details).     Suspect patient is having allergic reaction to fire ant bite.  Mildly concerned for cellulitis of the affected leg as well.  Will treat with doxycycline and IM steroid.  Patient advised to discontinue azithromycin.  Advised cool compresses to affected area and following up if any symptoms persist or worsen.  Will obtain BMP and CBC to rule out any worrisome infection.  No signs of anaphylaxis on exam.  Advised strict return and ER precautions.  Patient verbalized understanding and was agreeable with plan. Final Clinical Impressions(s) / UC Diagnoses   Final diagnoses:  Fire ant bite, accidental or unintentional, initial encounter  Cellulitis of leg, left  Allergic reaction, initial encounter     Discharge Instructions      Stop Azithromycin.  Start doxycycline.  Steroid shot was also administered today.  Use cool compresses as we discussed.  Follow-up if any symptoms persist or worsen.    ED Prescriptions     Medication Sig Dispense Auth. Provider   doxycycline (VIBRAMYCIN) 100 MG capsule Take 1 capsule (100 mg total) by mouth 2 (two) times daily. 20 capsule Gustavus Bryant, [place]      PDMP not reviewed this encounter.   Gustavus Bryant, [place] 05/21/23 1423

## 2023-05-21 NOTE — ED Triage Notes (Addendum)
Pt reports getting bit by a fire ant on left lower leg 3-4 days ago. Erythematous lesion noted; pt c/o increased swelling to surrounding tissue. Pt states he started taking a leftover Rx for Zithromax from [place].

## 2023-05-21 NOTE — Discharge Instructions (Addendum)
Stop Azithromycin.  Start doxycycline.  Steroid shot was also administered today.  Use cool compresses as we discussed.  Follow-up if any symptoms persist or worsen.

## 2023-05-22 LAB — CBC
Hematocrit: 45.5 % (ref 37.5–51.0)
Hemoglobin: 15 g/dL (ref 13.0–17.7)
MCH: 29.5 pg (ref 26.6–33.0)
MCHC: 33 g/dL (ref 31.5–35.7)
MCV: 90 fL (ref 79–97)
Platelets: 153 10*3/uL (ref 150–450)
RBC: 5.08 x10E6/uL (ref 4.14–5.80)
RDW: 12.6 % (ref 11.6–15.4)
WBC: 3.4 10*3/uL (ref 3.4–10.8)

## 2023-05-22 LAB — BASIC METABOLIC PANEL
BUN/Creatinine Ratio: 11 (ref 9–20)
BUN: 13 mg/dL (ref 6–24)
CO2: 23 mmol/L (ref 20–29)
Calcium: 9.1 mg/dL (ref 8.7–10.2)
Chloride: 103 mmol/L (ref 96–106)
Creatinine, Ser: 1.18 mg/dL (ref 0.76–1.27)
Glucose: 88 mg/dL (ref 70–99)
Potassium: 4.3 mmol/L (ref 3.5–5.2)
Sodium: 141 mmol/L (ref 134–144)
eGFR: 74 mL/min/{1.73_m2} (ref >59)

## 2023-05-26 ENCOUNTER — Other Ambulatory Visit: Payer: Self-pay

## 2023-05-26 DIAGNOSIS — Z130 Encounter for screening for diseases of the blood and blood-forming organs and certain disorders involving the immune mechanism: Secondary | ICD-10-CM

## 2023-05-26 DIAGNOSIS — Z0000 Encounter for general adult medical examination without abnormal findings: Secondary | ICD-10-CM

## 2023-06-02 ENCOUNTER — Other Ambulatory Visit: Payer: 59

## 2023-06-02 DIAGNOSIS — Z0000 Encounter for general adult medical examination without abnormal findings: Secondary | ICD-10-CM

## 2023-06-02 DIAGNOSIS — Z13228 Encounter for screening for other metabolic disorders: Secondary | ICD-10-CM | POA: Diagnosis not present

## 2023-06-02 DIAGNOSIS — Z1321 Encounter for screening for nutritional disorder: Secondary | ICD-10-CM | POA: Diagnosis not present

## 2023-06-02 DIAGNOSIS — Z130 Encounter for screening for diseases of the blood and blood-forming organs and certain disorders involving the immune mechanism: Secondary | ICD-10-CM

## 2023-06-02 DIAGNOSIS — Z1329 Encounter for screening for other suspected endocrine disorder: Secondary | ICD-10-CM | POA: Diagnosis not present

## 2023-06-03 LAB — CBC WITH DIFFERENTIAL/PLATELET

## 2023-06-03 LAB — COMPREHENSIVE METABOLIC PANEL
AST: 21 IU/L (ref 0–40)
Albumin: 4.3 g/dL (ref 3.8–4.9)
BUN: 13 mg/dL (ref 6–24)
Bilirubin Total: 0.7 mg/dL (ref 0.0–1.2)
CO2: 24 mmol/L (ref 20–29)
Chloride: 103 mmol/L (ref 96–106)
Globulin, Total: 1.9 g/dL (ref 1.5–4.5)
Glucose: 91 mg/dL (ref 70–99)
Sodium: 142 mmol/L (ref 134–144)
Total Protein: 6.2 g/dL (ref 6.0–8.5)

## 2023-06-03 LAB — LIPID PANEL
Chol/HDL Ratio: 4.1 ratio (ref 0.0–5.0)
Cholesterol, Total: 222 mg/dL — ABNORMAL HIGH (ref 100–199)
HDL: 54 mg/dL (ref >39)
LDL Chol Calc (NIH): 152 mg/dL — ABNORMAL HIGH (ref 0–99)
Triglycerides: 89 mg/dL (ref 0–149)
VLDL Cholesterol Cal: 16 mg/dL (ref 5–40)

## 2023-06-03 LAB — HEMOGLOBIN A1C

## 2023-06-03 LAB — TSH: TSH: 1.36 u[IU]/mL (ref 0.450–4.500)

## 2023-06-05 LAB — COMPREHENSIVE METABOLIC PANEL: ALT: 30 IU/L (ref 0–44)

## 2023-06-05 LAB — CBC WITH DIFFERENTIAL/PLATELET

## 2023-06-08 ENCOUNTER — Ambulatory Visit: Payer: 59 | Admitting: Family Medicine

## 2023-06-08 ENCOUNTER — Encounter: Payer: Self-pay | Admitting: Family Medicine

## 2023-06-08 VITALS — BP 122/80 | HR 66 | Ht 68.0 in | Wt 184.1 lb

## 2023-06-08 DIAGNOSIS — E7800 Pure hypercholesterolemia, unspecified: Secondary | ICD-10-CM

## 2023-06-08 DIAGNOSIS — G5731 Lesion of lateral popliteal nerve, right lower limb: Secondary | ICD-10-CM | POA: Diagnosis not present

## 2023-06-08 LAB — POCT GLYCOSYLATED HEMOGLOBIN (HGB A1C): HbA1c POC (<> result, manual entry): 5.6 % (ref 4.0–5.6)

## 2023-06-08 NOTE — Progress Notes (Unsigned)
   Established Patient Office Visit  Subjective   Patient ID: James Cherry, male    DOB: Jun 27, 1971  Age: 52 y.o. MRN: [id number]  Chief Complaint  Patient presents with   Annual Exam    HPI  Patient here for yearly physical.  We discussed his lab work which was grossly normal except for elevated LDL and total cholesterol.  Discussed dietary changes including decreasing saturated fat intake, exercise, weight loss.  Discussed his A1c which is on the borderline of being prediabetic.  Has been consistent for the past few years.  Patient exercises regularly.  Swims.  Uses a treadmill.  Lives with his wife and 2 teenage children.  Owns a [employer].  I saw him recently for traveler's diarrhea.  This is since resolved.  He also recently was bitten by an ant and was treated with doxycycline.  That has resolved.  Patient's only complaint is off-and-on foot pain going on for the past 6 months.  Occurs maybe once a week.  It lasts 1 to 2 seconds and is a sudden shooting burning pain in the top of the foot and possibly the lateral aspect of the lower leg.  Does not extend beyond the knee.   The 10-year ASCVD risk score (Arnett DK, et al., 2019) is: 4.5%   {History (Optional):23778}  ROS    Objective:     BP 122/80   Pulse 66   Ht 5\' 8"  (1.727 m)   Wt 184 lb 1.9 oz (83.5 kg)   SpO2 97%   BMI 28.00 kg/m  {Vitals History (Optional):23777}  Physical Exam General: Alert, oriented H&T: PERRLA, EOMI, normal TM CV: Regular rate and rhythm Pulmonary: Lungs clear bilaterally GI: Soft, nontender MSK: Strength equal bilaterally normal lower extremity range of motion, no tenderness.  No swelling. Psych: Pleasant affect Skin: Warm and dry.  3-4 healing insect bites on the lower extremities.   No results found for any visits on 06/08/23.  {Labs (Optional):23779}      Assessment & Plan:   Elevated LDL cholesterol level Assessment & Plan: Overall ASCVD risk score is  low.  No indication to start statin.  Discussed decreasing saturated fats, discussed diet, exercise which patient is already doing.  Will recheck again in 1 year.   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  Discussed positional changes and avoiding prolonged crossing of the legs when seated.  Advised conservative treatment and monitoring.  Patient notified to inform us if symptoms worsen or change.      Return in about 1 year (around 06/07/2024) for physical.    Sandre Kitty, MD

## 2023-06-08 NOTE — Patient Instructions (Signed)
It was nice to see you today,  We addressed the following topics today: - limit your daily saturated fat intake to 15 grams per day or less - follow up in 1 year for your physical - your pain is from a nerve impingement most likely.  Try to avoid sitting with your legs crossed for an extended period of time as this can compress the nerve.    Have a great day,  Frederic Jericho, MD

## 2023-06-08 NOTE — Assessment & Plan Note (Signed)
Brief episodic occasional pain lasting a few seconds.  Discussed positional changes and avoiding prolonged crossing of the legs when seated.  Advised conservative treatment and monitoring.  Patient notified to inform us if symptoms worsen or change.

## 2023-06-08 NOTE — Assessment & Plan Note (Signed)
Overall ASCVD risk score is low.  No indication to start statin.  Discussed decreasing saturated fats, discussed diet, exercise which patient is already doing.  Will recheck again in 1 year.

## 2023-06-09 ENCOUNTER — Other Ambulatory Visit: Payer: Self-pay | Admitting: Family Medicine

## 2023-06-09 DIAGNOSIS — Z125 Encounter for screening for malignant neoplasm of prostate: Secondary | ICD-10-CM

## 2023-06-22 ENCOUNTER — Other Ambulatory Visit: Payer: 59

## 2023-06-22 DIAGNOSIS — Z125 Encounter for screening for malignant neoplasm of prostate: Secondary | ICD-10-CM | POA: Diagnosis not present

## 2024-01-03 ENCOUNTER — Ambulatory Visit
Admission: EM | Admit: 2024-01-03 | Discharge: 2024-01-03 | Disposition: A | Discharge status: Home / Self Care | Payer: 59 | Attending: Family Medicine | Admitting: Family Medicine

## 2024-01-03 DIAGNOSIS — S39012A Strain of muscle, fascia and tendon of lower back, initial encounter: Secondary | ICD-10-CM | POA: Diagnosis not present

## 2024-01-03 MED ORDER — CYCLOBENZAPRINE HCL 10 MG PO TABS
10.0000 mg | ORAL_TABLET | Freq: Two times a day (BID) | ORAL | 0 refills | Status: AC | PRN
Start: 2024-01-03 — End: ?

## 2024-01-03 MED ORDER — PREDNISONE 10 MG (21) PO TBPK
ORAL_TABLET | Freq: Every day | ORAL | 0 refills | Status: DC
Start: 2024-01-03 — End: 2024-06-14

## 2024-01-03 NOTE — ED Provider Notes (Signed)
 [facility]    CSN: [id number] Arrival date & time: 01/03/24  0820      History   Chief Complaint Chief Complaint  Patient presents with   Back Pain    HPI James Cherry is a 53 y.o. male presents for back pain.  Patient reports 2 to 3 days of a persistent waxing and waning lower back pain that radiates slightly to the left buttock.  Began after he cleaned out his garage and did a lot of lifting but denies any injury such as fall.  No numbness/tingling/weakness of his lower extremities, no bowel or bladder incontinence, no saddle paresthesia.  Denies history of back surgeries or fractures in the past.  He took 800 mg ibuprofen 2 hours prior to arrival with no change in symptoms.  No other concerns at this time.   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 Authorizing Provider  cyclobenzaprine (FLEXERIL) 10 MG tablet Take 1 tablet (10 mg total) by mouth 2 (two) times daily as needed for muscle spasms. 01/03/24  Yes Radford Pax, NP  predniSONE (STERAPRED UNI-PAK 21 TAB) 10 MG (21) TBPK tablet Take by mouth daily. Take 6 tabs by mouth daily  for 1 day, then 5 tabs for 1 day, then 4 tabs for 1 day, then 3 tabs for 1 day, 2 tabs for 1 day, then 1 tab by mouth daily for 1 days 01/03/24  Yes Radford Pax, NP  ibuprofen (ADVIL,MOTRIN) 200 MG tablet Take 800 mg by mouth every 6 (six) hours as needed for pain.   Yes [provider]  pantoprazole (PROTONIX) 40 MG tablet Take 40 mg by mouth every morning.    [provider]  PANTOPRAZOLE SODIUM PO Take by mouth.    [provider]    Family History Family History  Problem Relation Age of Onset   Hyperlipidemia Mother    Aortic aneurysm Father    Aneurysm Father        iliac   Stroke Maternal Grandfather     Social History Social History   Tobacco Use   Smoking status: Never    Passive exposure: Never   Smokeless tobacco: Never  Vaping Use   Vaping status: Never Used  Substance Use Topics   Alcohol use: Yes    Comment: occasionally   Drug use: Never     Allergies   Meperidine and Penicillins   Review of Systems Review of Systems  Musculoskeletal:  Positive for back pain.     Physical Exam Triage Vital Signs ED Triage Vitals  Encounter Vitals Group     BP 01/03/24 0909 136/79     Systolic BP Percentile --      Diastolic BP Percentile --      Pulse Rate 01/03/24 0909 63     Resp 01/03/24 0909 18     Temp 01/03/24  0909 97.8 F (36.6 C)     Temp Source 01/03/24 0909 Oral     SpO2 01/03/24 0909 96 %     Weight 01/03/24 0907 185 lb (83.9 kg)     Height 01/03/24 0907 5\' 7"  (1.702 m)     Head Circumference --      Peak Flow --      Pain Score 01/03/24 0903 10     Pain Loc --      Pain Education --      Exclude from Growth Chart --    No data found.  Updated Vital Signs BP 136/79 (BP Location: Right Arm)   Pulse 63   Temp 97.8 F (36.6 C) (Oral)   Resp 18   Ht 5\' 7"  (1.702 m)   Wt 185 lb (83.9 kg)   SpO2 96%   BMI 28.98 kg/m   Visual Acuity Right Eye Distance:   Left Eye Distance:   Bilateral Distance:    Right Eye Near:   Left Eye Near:    Bilateral Near:     Physical Exam Vitals and nursing note reviewed.  Constitutional:      General: He is not in acute distress.    Appearance: Normal appearance. He is not ill-appearing.  HENT:     Head: Normocephalic and atraumatic.  Eyes:      Pupils: Pupils are equal, round, and reactive to light.  Cardiovascular:     Rate and Rhythm: Normal rate.  Pulmonary:     Effort: Pulmonary effort is normal.  Musculoskeletal:     Thoracic back: Normal.     Lumbar back: Tenderness present. No swelling, edema, deformity, signs of trauma, lacerations, spasms or bony tenderness. Normal range of motion. Negative right straight leg raise test and negative left straight leg raise test. No scoliosis.       Back:     Comments: 5 out of 5 bilateral lower extremity strength  Skin:    General: Skin is warm and dry.  Neurological:     General: No focal deficit present.     Mental Status: He is alert and oriented to person, place, and time.  Psychiatric:        Mood and Affect: Mood normal.        Behavior: Behavior normal.      UC Treatments / Results  Labs (all labs ordered are listed, but only abnormal results are displayed) Labs Reviewed - No data to display  EKG   Radiology No results found.  Procedures Procedures (including critical care time)  Medications Ordered in UC Medications - No data to display  Initial Impression / Assessment and Plan / UC Course  I have reviewed the triage vital signs and the nursing notes.  Pertinent labs & imaging results that were available during my care of the patient were reviewed by me and considered in my medical decision making (see chart for details).     Reviewed exam and symptoms with patient.  No red flags.  Discussed low back strain.  Trial of Flexeril and prednisone taper, side effect profile reviewed.  No Toradol as patient took ibuprofen recently.  Discussed rest and heat.  PCP follow-up if symptoms do not improve.  ER precautions reviewed. Final Clinical Impressions(s) / UC Diagnoses   Final diagnoses:  Strain of lumbar region, initial encounter     Discharge Instructions      Start Flexeril twice daily as needed.  Please note this medication can make you drowsy.  Do not  drink alcohol or drive on this medication.  Start prednisone taper as prescribed.  Lots of rest and use heat to the low back as needed.  Follow-up with your PCP if your symptoms do not improve.  Please go to the ER for any worsening symptoms.  Hope you feel better soon!    ED Prescriptions     Medication Sig Dispense Auth. Provider   cyclobenzaprine (FLEXERIL) 10 MG tablet Take 1 tablet (10 mg total) by mouth 2 (two) times daily as needed for muscle spasms. 10 tablet Radford Pax, NP   predniSONE (STERAPRED UNI-PAK 21 TAB) 10 MG (21) TBPK tablet Take by mouth daily. Take 6 tabs by mouth daily  for 1 day, then 5 tabs for 1 day, then 4 tabs for 1 day, then 3 tabs for 1 day, 2 tabs for 1 day, then 1 tab by mouth daily for 1 days 21 tablet Radford Pax, NP      PDMP not reviewed this encounter.   Radford Pax, NP 01/03/24 [phone]

## 2024-01-03 NOTE — ED Triage Notes (Signed)
"  I hurt my back (Saturday) morning or late Friday evening. I was cleaning out my garage the next day (Saturday) and it just tweaked my back and it has killed me since". Pain seems to be intense in the middle (low) and some radiation to left buttocks last night.

## 2024-01-03 NOTE — Discharge Instructions (Addendum)
 Start Flexeril twice daily as needed.  Please note this medication can make you drowsy.  Do not drink alcohol or drive on this medication.  Start prednisone taper as prescribed.  Lots of rest and use heat to the low back as needed.  Follow-up with your PCP if your symptoms do not improve.  Please go to the ER for any worsening symptoms.  Hope you feel better soon!

## 2024-02-14 DIAGNOSIS — K219 Gastro-esophageal reflux disease without esophagitis: Secondary | ICD-10-CM | POA: Diagnosis not present

## 2024-02-14 DIAGNOSIS — Z860100 Personal history of colon polyps, unspecified: Secondary | ICD-10-CM | POA: Diagnosis not present

## 2024-02-14 DIAGNOSIS — K200 Eosinophilic esophagitis: Secondary | ICD-10-CM | POA: Diagnosis not present

## 2024-06-01 ENCOUNTER — Other Ambulatory Visit: Payer: Self-pay | Admitting: *Deleted

## 2024-06-01 ENCOUNTER — Other Ambulatory Visit: Payer: 59

## 2024-06-01 DIAGNOSIS — E7800 Pure hypercholesterolemia, unspecified: Secondary | ICD-10-CM

## 2024-06-01 DIAGNOSIS — Z125 Encounter for screening for malignant neoplasm of prostate: Secondary | ICD-10-CM | POA: Diagnosis not present

## 2024-06-02 ENCOUNTER — Ambulatory Visit: Payer: Self-pay | Admitting: Family Medicine

## 2024-06-02 LAB — HEMOGLOBIN A1C
Est. average glucose Bld gHb Est-mCnc: 111 mg/dL
Hgb A1c MFr Bld: 5.5 % (ref 4.8–5.6)

## 2024-06-02 LAB — COMPREHENSIVE METABOLIC PANEL WITH GFR
ALT: 36 IU/L (ref 0–44)
AST: 28 IU/L (ref 0–40)
Albumin: 4.2 g/dL (ref 3.8–4.9)
Alkaline Phosphatase: 64 IU/L (ref 44–121)
BUN/Creatinine Ratio: 12 (ref 9–20)
BUN: 12 mg/dL (ref 6–24)
Bilirubin Total: 0.5 mg/dL (ref 0.0–1.2)
CO2: 21 mmol/L (ref 20–29)
Calcium: 9 mg/dL (ref 8.7–10.2)
Chloride: 102 mmol/L (ref 96–106)
Creatinine, Ser: 1.03 mg/dL (ref 0.76–1.27)
Globulin, Total: 2 g/dL (ref 1.5–4.5)
Glucose: 97 mg/dL (ref 70–99)
Potassium: 4.2 mmol/L (ref 3.5–5.2)
Sodium: 138 mmol/L (ref 134–144)
Total Protein: 6.2 g/dL (ref 6.0–8.5)
eGFR: 86 mL/min/1.73 (ref >59)

## 2024-06-02 LAB — LIPID PANEL
Chol/HDL Ratio: 5.8 ratio — ABNORMAL HIGH (ref 0.0–5.0)
Cholesterol, Total: 238 mg/dL — ABNORMAL HIGH (ref 100–199)
HDL: 41 mg/dL (ref >39)
LDL Chol Calc (NIH): 150 mg/dL — ABNORMAL HIGH (ref 0–99)
Triglycerides: 254 mg/dL — ABNORMAL HIGH (ref 0–149)
VLDL Cholesterol Cal: 47 mg/dL — ABNORMAL HIGH (ref 5–40)

## 2024-06-02 LAB — PSA: Prostate Specific Ag, Serum: 1.1 ng/mL (ref 0.0–4.0)

## 2024-06-08 ENCOUNTER — Encounter: Payer: 59 | Admitting: Family Medicine

## 2024-06-14 ENCOUNTER — Encounter: Payer: Self-pay | Admitting: Family Medicine

## 2024-06-14 ENCOUNTER — Ambulatory Visit (INDEPENDENT_AMBULATORY_CARE_PROVIDER_SITE_OTHER): Admitting: Family Medicine

## 2024-06-14 VITALS — BP 136/85 | HR 71 | Ht 67.0 in | Wt 195.0 lb

## 2024-06-14 DIAGNOSIS — Z0000 Encounter for general adult medical examination without abnormal findings: Secondary | ICD-10-CM | POA: Diagnosis not present

## 2024-06-14 DIAGNOSIS — R42 Dizziness and giddiness: Secondary | ICD-10-CM | POA: Diagnosis not present

## 2024-06-14 DIAGNOSIS — E7800 Pure hypercholesterolemia, unspecified: Secondary | ICD-10-CM | POA: Diagnosis not present

## 2024-06-14 NOTE — Progress Notes (Unsigned)
   Annual physical  Subjective   Patient ID: James Cherry, male    DOB: [birth year]  Age: 53 y.o. MRN: [id number]  Chief Complaint  Patient presents with  . Annual Exam   HPI James Cherry is a 53 y.o. old male here  for annual exam.   Work:*** Relationship:*** Children:*** Tobacco:*** Alcohol:*** Recreational drugs:***  Diet:*** Exercise:***  Family history of prostate or colorectal cancer:***  Advance directive:***  Other providers:***  HPI  Separate, acute concerns today: ***  The 10-year ASCVD risk score (Arnett DK, et al., 2019) is: 9.2%  Health Maintenance Due  Topic Date Due  . HIV Screening  Never done  . Hepatitis C Screening  Never done  . Hepatitis B Vaccines (1 of 3 - 19+ 3-dose series) Never done  . Fecal DNA (Cologuard)  Never done  . COVID-19 Vaccine (1 - 2024-25 season) Never done  . INFLUENZA VACCINE  06/09/2024      Objective:     BP (!) 144/9   Pulse 71   Ht 5' 7 (1.702 m)   Wt 195 lb (88.5 kg)   SpO2 96%   BMI 30.54 kg/m  {Vitals History (Optional):23777}  Physical Exam   No results found for any visits on 06/14/24.      Assessment & Plan:   There are no diagnoses linked to this encounter.   No follow-ups on file.    Toribio MARLA Slain, MD

## 2024-06-14 NOTE — Patient Instructions (Signed)
 It was nice to see you today,  We addressed the following topics today: -the additional blood tests to get for evaluating your cholesterol level is the lipoprotein a and apolipoprotein B. - The CT scan that evaluates your risk of cardiovascular disease is called a coronary artery calcium score. - If you want either of these let us  know - If you continue to have issues with dizziness or lightheadedness let us  know, and schedule a follow-up visit.  Have a great day,  Rolan Slain, MD

## 2024-06-16 DIAGNOSIS — R42 Dizziness and giddiness: Secondary | ICD-10-CM | POA: Insufficient documentation

## 2024-06-16 NOTE — Assessment & Plan Note (Signed)
 Presents with a new episode of dizziness/lightheadedness that started on 06/12/2024, described as a mix of lightheadedness with movement and mild vertigo when supine. This has occurred 1-2 times in the past few years, previously associated with dehydration. Denies nausea, palpitations, or other associated symptoms. Home BP readings were 133/77 yesterday and 145-148 this morning. Exam today is unremarkable. Likely related to dehydration, though other causes are possible. No nystagmus on exam.   - If symptoms worsen or become more frequent, schedule a follow-up visit. - Advised on checking blood sugar at home if desired. - Discussed the difference between dizziness and vertigo.

## 2024-06-16 NOTE — Assessment & Plan Note (Signed)
 History of moderately high cholesterol, stable for the past 5 years. Currently not on medical therapy. Engages in regular exercise. Labs show no other cause for dizziness. ASCVD risk score is 9.2%. Discussed options for further risk stratification. - Counseled on further testing options to better assess cardiovascular risk, including Lp(a) and ApoB blood tests, and a coronary artery calcium score (CT scan). - Apoprotein B (ApoB) and Lipoprotein(a) [Lp(a)] can be ordered to further assess cholesterol-related risk. - Can call to have these tests ordered at any time if he decides to proceed.

## 2024-09-12 DIAGNOSIS — D485 Neoplasm of uncertain behavior of skin: Secondary | ICD-10-CM | POA: Diagnosis not present

## 2024-09-15 DIAGNOSIS — C44319 Basal cell carcinoma of skin of other parts of face: Secondary | ICD-10-CM | POA: Diagnosis not present

## 2025-06-08 ENCOUNTER — Other Ambulatory Visit

## 2025-06-15 ENCOUNTER — Encounter: Admitting: Family Medicine
